# Patient Record
Sex: Male | Born: 1995 | Race: Black or African American | Hispanic: No | Marital: Single | Smoking: Never smoker
Health system: Southern US, Community
[De-identification: ages and names within clinical notes are randomized; demographics above are authoritative.]

## PROBLEM LIST (undated history)

## (undated) HISTORY — PX: WISDOM TOOTH EXTRACTION: SHX21

---
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
## 2015-07-01 ENCOUNTER — Encounter: Payer: Self-pay | Admitting: Physician Assistant

## 2015-07-01 ENCOUNTER — Ambulatory Visit (INDEPENDENT_AMBULATORY_CARE_PROVIDER_SITE_OTHER): Payer: Worker's Compensation | Admitting: Physician Assistant

## 2015-07-01 VITALS — BP 131/90 | HR 69 | Temp 97.9°F | Ht 73.0 in | Wt 218.6 lb

## 2015-07-01 DIAGNOSIS — S61219A Laceration without foreign body of unspecified finger without damage to nail, initial encounter: Secondary | ICD-10-CM

## 2015-07-01 DIAGNOSIS — S61213A Laceration without foreign body of left middle finger without damage to nail, initial encounter: Secondary | ICD-10-CM

## 2015-07-01 MED ORDER — SULFAMETHOXAZOLE-TRIMETHOPRIM 800-160 MG PO TABS: 1.0000 | ORAL_TABLET | Freq: Two times a day (BID) | ORAL | Status: DC

## 2015-07-01 NOTE — Patient Instructions (Signed)

## 2015-07-01 NOTE — Progress Notes (Signed)
   Subjective:    Patient ID: Kent Roman, male    DOB: 01-21-1996, 19 y.o.   MRN: [id number]  HPI 19 y/o male presents with laceration on  3rd finger, left hand s/p injury at work on 07/01/15 while cutting on a mattress top on a machine. He does not know if he is utd on his tetanus vaccine.     Review of Systems  Constitutional: Negative.   HENT: Negative.   Eyes: Negative.   Respiratory: Negative.   Cardiovascular: Negative.   Gastrointestinal: Negative.   Skin:       Laceration on 3rd finger, left hand       Objective:   Physical Exam  Constitutional: He appears well-developed and well-nourished.  Cardiovascular: Intact distal pulses.   Skin:  Approximately 3 cm laceration of 3rd finger, left hand. Slightly extends into the nail bed. No tendon involvement.  Nursing note and vitals reviewed.         Assessment & Plan:  1. Laceration of finger, initial encounter - Wound cleansed and 4.0 nylon sutures x 8.  - sulfamethoxazole-trimethoprim (BACTRIM DS,SEPTRA DS) 800-160 MG per tablet; Take 1 tablet by mouth 2 (two) times daily.  Dispense: 20 tablet; Refill: 0  Patient will rtc in 10 days for suture removal. May return to work with no restrictions    2. Need for Tetanus- patient will check status and we will give on follow up if needed.   Jayjay Littles A. Chauncey ReadingGann PA-C

## 2015-07-03 ENCOUNTER — Encounter: Payer: Self-pay | Admitting: Physician Assistant

## 2015-07-08 ENCOUNTER — Telehealth: Payer: Self-pay | Admitting: Family Medicine

## 2015-07-09 ENCOUNTER — Other Ambulatory Visit: Payer: Self-pay | Admitting: Physician Assistant

## 2015-07-09 ENCOUNTER — Ambulatory Visit (INDEPENDENT_AMBULATORY_CARE_PROVIDER_SITE_OTHER): Payer: Worker's Compensation | Admitting: Physician Assistant

## 2015-07-09 ENCOUNTER — Encounter: Payer: Self-pay | Admitting: Physician Assistant

## 2015-07-09 VITALS — BP 130/86 | HR 56 | Temp 98.2°F | Ht 73.0 in | Wt 231.0 lb

## 2015-07-09 DIAGNOSIS — Z23 Encounter for immunization: Secondary | ICD-10-CM

## 2015-07-09 DIAGNOSIS — S61213D Laceration without foreign body of left middle finger without damage to nail, subsequent encounter: Secondary | ICD-10-CM

## 2015-07-09 DIAGNOSIS — Z4802 Encounter for removal of sutures: Secondary | ICD-10-CM | POA: Diagnosis not present

## 2015-07-09 NOTE — Telephone Encounter (Signed)
Appointment rescheduled for today at 3:40.

## 2015-07-09 NOTE — Progress Notes (Addendum)
Patient ID: Kent Roman, male   DOB: 04-26-1996, 19 y.o.   MRN: [id number]   19 y/o male presents for suture removal s/p laceration of 3rd finger, left hand that occurred while he was working. He has had no complications, took all of his antibiotic.   PE: 1 suture was removed accidentally by patient when removing bandage. Distal pulses intact. Skin intact with no dehiscence of wound.   A/P: Advised patient to apply vaseline or vitamin e oil for scar improvment. No further treatment needed.  Patient can continue at work with no restrictions.  1. Sutures removed   2Need for prophylactic vaccination with combined diphtheria-tetanus-pertussis (DTP) vaccine  - Td : Tetanus/diphtheria >7yo Preservative  free    Kent Roman A. Chauncey Reading PA-C

## 2018-01-25 ENCOUNTER — Other Ambulatory Visit: Payer: Self-pay

## 2018-01-25 ENCOUNTER — Emergency Department (HOSPITAL_COMMUNITY)
Admission: EM | Admit: 2018-01-25 | Discharge: 2018-01-25 | Disposition: A | Discharge status: Home / Self Care | Payer: Self-pay | Attending: Emergency Medicine | Admitting: Emergency Medicine

## 2018-01-25 ENCOUNTER — Encounter (HOSPITAL_COMMUNITY): Payer: Self-pay

## 2018-01-25 DIAGNOSIS — R030 Elevated blood-pressure reading, without diagnosis of hypertension: Secondary | ICD-10-CM | POA: Insufficient documentation

## 2018-01-25 DIAGNOSIS — J45909 Unspecified asthma, uncomplicated: Secondary | ICD-10-CM | POA: Insufficient documentation

## 2018-01-25 DIAGNOSIS — R69 Illness, unspecified: Secondary | ICD-10-CM

## 2018-01-25 DIAGNOSIS — J111 Influenza due to unidentified influenza virus with other respiratory manifestations: Secondary | ICD-10-CM | POA: Insufficient documentation

## 2018-01-25 MED ORDER — OSELTAMIVIR PHOSPHATE 75 MG PO CAPS
75.0000 mg | ORAL_CAPSULE | Freq: Once | ORAL | Status: AC
  Administered 2018-01-25: 75 mg via ORAL
  Filled 2018-01-25: qty 1

## 2018-01-25 MED ORDER — LORATADINE-PSEUDOEPHEDRINE ER 5-120 MG PO TB12: 1.0000 | ORAL_TABLET | Freq: Two times a day (BID) | ORAL | 0 refills | Status: DC

## 2018-01-25 MED ORDER — IBUPROFEN 600 MG PO TABS: 600.0000 mg | ORAL_TABLET | Freq: Four times a day (QID) | ORAL | 0 refills | Status: DC

## 2018-01-25 MED ORDER — IBUPROFEN 800 MG PO TABS
800.0000 mg | ORAL_TABLET | Freq: Once | ORAL | Status: AC
  Administered 2018-01-25: 800 mg via ORAL
  Filled 2018-01-25: qty 1

## 2018-01-25 MED ORDER — ONDANSETRON HCL 4 MG PO TABS
4.0000 mg | ORAL_TABLET | Freq: Once | ORAL | Status: AC
Start: 2018-01-25 — End: 2018-01-25
  Administered 2018-01-25: 4 mg via ORAL
  Filled 2018-01-25: qty 1

## 2018-01-25 NOTE — ED Provider Notes (Signed)
[facility] EMERGENCY DEPARTMENT Provider Note   CSN: [id number] Arrival date & time: 01/25/18  1032     History   Chief Complaint Chief Complaint  Patient presents with  . Generalized Body Aches    HPI Kent Roman is a 22 y.o. male.  The history is provided by the patient.  URI   This is a new problem. The current episode started yesterday. The problem has been gradually worsening. Associated symptoms include congestion, rhinorrhea, sore throat and cough. Pertinent negatives include no chest pain, no abdominal pain, no dysuria, no neck pain and no wheezing. He has tried nothing for the symptoms.    Past Medical History:  Diagnosis Date  . Asthma     There are no active problems to display for this patient.   History reviewed. No pertinent surgical history.     Home Medications    Prior to Admission medications   Not on File    Family History Family History  Problem Relation Age of Onset  . Hypertension Other   . Cancer Other     Social History Social History   Tobacco Use  . Smoking status: Never Smoker  . Smokeless tobacco: Never Used  Substance Use Topics  . Alcohol use: No  . Drug use: No     Allergies   Patient has no known allergies.   Review of Systems Review of Systems  Constitutional: Positive for activity change, appetite change and chills.       All ROS Neg except as noted in HPI  HENT: Positive for congestion, rhinorrhea and sore throat. Negative for nosebleeds.   Eyes: Negative for photophobia and discharge.  Respiratory: Positive for cough. Negative for shortness of breath and wheezing.   Cardiovascular: Negative for chest pain and palpitations.  Gastrointestinal: Negative for abdominal pain and blood in stool.  Genitourinary: Negative for dysuria, frequency and hematuria.  Musculoskeletal: Negative for arthralgias, back pain and neck pain.  Skin: Negative.   Neurological: Negative for dizziness, seizures and speech  difficulty.  Psychiatric/Behavioral: Negative for confusion and hallucinations.     Physical Exam Updated Vital Signs BP (!) 156/93 (BP Location: Right Arm)   Pulse 68   Temp 99.4 F (37.4 C) (Oral)   Resp 16   Ht 6\' 2"  (1.88 m)   Wt 97.5 kg (215 lb)   SpO2 100%   BMI 27.60 kg/m   Physical Exam  Constitutional: He is oriented to person, place, and time. He appears well-developed and well-nourished.  Non-toxic appearance.  HENT:  Head: Normocephalic.  Right Ear: Tympanic membrane and external ear normal.  Left Ear: Tympanic membrane and external ear normal.  Nasal congestion present.  Eyes: EOM and lids are normal. Pupils are equal, round, and reactive to light.  Neck: Normal range of motion. Neck supple. Carotid bruit is not present.  Cardiovascular: Normal rate, regular rhythm, normal heart sounds, intact distal pulses and normal pulses.  Pulmonary/Chest: Breath sounds normal. No respiratory distress.  Abdominal: Soft. Bowel sounds are normal. There is no tenderness. There is no guarding.  Musculoskeletal: Normal range of motion.  Lymphadenopathy:       Head (right side): No submandibular adenopathy present.       Head (left side): No submandibular adenopathy present.    He has no cervical adenopathy.  Neurological: He is alert and oriented to person, place, and time. He has normal strength. No cranial nerve deficit or sensory deficit. Coordination normal.  Skin: Skin is warm and dry.  Psychiatric: He has a normal mood and affect. His speech is normal.  Nursing note and vitals reviewed.    ED Treatments / Results  Labs (all labs ordered are listed, but only abnormal results are displayed) Labs Reviewed - No data to display  EKG  EKG Interpretation None       Radiology No results found.  Procedures Procedures (including critical care time)  Medications Ordered in ED Medications - No data to display   Initial Impression / Assessment and Plan / ED Course    I have reviewed the triage vital signs and the nursing notes.  Pertinent labs & imaging results that were available during my care of the patient were reviewed by me and considered in my medical decision making (see chart for details).       Final Clinical Impressions(s) / ED Diagnoses MDM Blood pressure is elevated at 156/93, otherwise the vital signs are within normal limits.  I have asked the patient have the blood pressure rechecked soon.  The examination suggest influenza-like illness.  Patient will increase fluids.  I have asked him to wash hands frequently and wipe down surfaces.  I have also asked the patient to use Claritin-D and ibuprofen for symptoms.  The patient is to return to the emergency department if any changes, problems, or concerns.   Final diagnoses:  Influenza-like illness    ED Discharge Orders    None       Ivery QualeBryant, Jonnelle Lawniczak, PA-C 01/25/18 1151    Loren RacerYelverton, David, MD 01/25/18 1531

## 2018-01-25 NOTE — ED Triage Notes (Signed)
Reports of generalized body aches, cough and nasal drainage since last night at work.

## 2018-01-25 NOTE — Discharge Instructions (Signed)
Please wash hands frequently.  Please increase water, juices, Gatorade, etc.  Use 600 mg of ibuprofen every 6 hours for fever and aching.  Use Claritin-D for congestion and cough.  Please see your primary physician or return to the emergency department if any changes, problems, or concerns.

## 2018-02-04 ENCOUNTER — Other Ambulatory Visit: Payer: Self-pay

## 2018-02-04 ENCOUNTER — Encounter (HOSPITAL_COMMUNITY): Payer: Self-pay | Admitting: *Deleted

## 2018-02-04 DIAGNOSIS — J45909 Unspecified asthma, uncomplicated: Secondary | ICD-10-CM | POA: Insufficient documentation

## 2018-02-04 DIAGNOSIS — G43909 Migraine, unspecified, not intractable, without status migrainosus: Secondary | ICD-10-CM | POA: Insufficient documentation

## 2018-02-04 DIAGNOSIS — L723 Sebaceous cyst: Secondary | ICD-10-CM | POA: Insufficient documentation

## 2018-02-04 NOTE — ED Triage Notes (Signed)
Pt c/o bump to right side of top of head that has been there about 2 months; pt states the pain is getting worse; pt denies any drainage; bump feels soft to touch

## 2018-02-05 ENCOUNTER — Emergency Department (HOSPITAL_COMMUNITY)
Admission: EM | Admit: 2018-02-05 | Discharge: 2018-02-05 | Disposition: A | Discharge status: Home / Self Care | Payer: Self-pay | Attending: Emergency Medicine | Admitting: Emergency Medicine

## 2018-02-05 DIAGNOSIS — G43009 Migraine without aura, not intractable, without status migrainosus: Secondary | ICD-10-CM

## 2018-02-05 DIAGNOSIS — L729 Follicular cyst of the skin and subcutaneous tissue, unspecified: Secondary | ICD-10-CM

## 2018-02-05 MED ORDER — METOCLOPRAMIDE HCL 5 MG/ML IJ SOLN
10.0000 mg | Freq: Once | INTRAMUSCULAR | Status: AC
  Administered 2018-02-05: 10 mg via INTRAVENOUS
  Filled 2018-02-05: qty 2

## 2018-02-05 MED ORDER — SODIUM CHLORIDE 0.9 % IV BOLUS (SEPSIS)
1000.0000 mL | Freq: Once | INTRAVENOUS | Status: AC
  Administered 2018-02-05: 1000 mL via INTRAVENOUS

## 2018-02-05 MED ORDER — DIPHENHYDRAMINE HCL 50 MG/ML IJ SOLN
25.0000 mg | Freq: Once | INTRAMUSCULAR | Status: AC
  Administered 2018-02-05: 25 mg via INTRAVENOUS
  Filled 2018-02-05: qty 1

## 2018-02-05 MED ORDER — SODIUM CHLORIDE 0.9 % IV BOLUS (SEPSIS)
500.0000 mL | Freq: Once | INTRAVENOUS | Status: AC
  Administered 2018-02-05: 500 mL via INTRAVENOUS

## 2018-02-05 MED ORDER — KETOROLAC TROMETHAMINE 30 MG/ML IJ SOLN
30.0000 mg | Freq: Once | INTRAMUSCULAR | Status: AC
  Administered 2018-02-05: 30 mg via INTRAVENOUS
  Filled 2018-02-05: qty 1

## 2018-02-05 NOTE — ED Provider Notes (Signed)
[facility] EMERGENCY DEPARTMENT Provider Note   CSN: [id number] Arrival date & time: 02/04/18  2316  Time seen 12:50 AM   History   Chief Complaint Chief Complaint  Patient presents with  . Headache    HPI Kent Roman is a 22 y.o. male.  HPI patient states he noticed a hard lump in his scalp on the right top of his head 2-3 months ago.  He states it has not changed in that time.  It is been painful the whole time it has been there.  It does not drain.  It has not gotten bigger.  He states he has a change in his headache pattern from about 1 a week to now twice a week.  He states his headaches generally is in the right eye and right frontal area and is throbbing.  He states his last headache started about 4 PM tonight.  He took ibuprofen 600 mg and laid down to go to sleep.  When he woke up to go to work he states he still had the headache.  He states he needs a work note.  He denies nausea, vomiting, blurred vision, numbness or tingling of his extremities.  He denies any fever or drainage from the lesion on his scalp.  He states with his headache he has photophobia and noise sensitivity.  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  He denies any known trauma.  PCP none  Past Medical History:  Diagnosis Date  . Asthma     There are no active problems to display for this patient.   History reviewed. No pertinent surgical history.     Home Medications    Prior to Admission medications   Medication Sig Start Date End Date Taking? Authorizing Provider  ibuprofen (ADVIL,MOTRIN) 600 MG tablet Take 1 tablet (600 mg total) by mouth 4 (four) times daily. 01/25/18   Ivery Quale, PA-C  loratadine-pseudoephedrine (CLARITIN-D 12 HOUR) 5-120 MG tablet Take 1 tablet by mouth 2 (two) times daily. 01/25/18   Ivery Quale, PA-C    Family History Family History  Problem Relation Age of Onset  . Hypertension  Other   . Cancer Other     Social History Social History   Tobacco Use  . Smoking status: Never Smoker  . Smokeless tobacco: Never Used  Substance Use Topics  . Alcohol use: No  . Drug use: No  employed   Allergies   Patient has no known allergies.   Review of Systems Review of Systems  All other systems reviewed and are negative.    Physical Exam Updated Vital Signs BP (!) 140/93 (BP Location: Right Arm)   Pulse 69   Temp 98.2 F (36.8 C) (Oral)   Resp 18   Ht 6\' 2"  (1.88 m)   Wt 97.5 kg (215 lb)   SpO2 99%   BMI 27.60 kg/m   Vital signs normal    Physical Exam  Constitutional: He is oriented to person, place, and time. He appears well-developed and well-nourished.  Non-toxic appearance. He does not appear ill. No distress.  HENT:  Head: Normocephalic and atraumatic.  Right Ear: External ear normal.  Left Ear: External ear normal.  Nose: Nose normal. No mucosal edema or rhinorrhea.  Mouth/Throat: Oropharynx is clear and moist and mucous membranes are normal. No dental abscesses or uvula swelling.  Eyes: Conjunctivae and EOM are normal. Pupils are equal, round,  and reactive to light.  Neck: Normal range of motion and full passive range of motion without pain. Neck supple.  Cardiovascular: Normal rate, regular rhythm and normal heart sounds. Exam reveals no gallop and no friction rub.  No murmur heard. Pulmonary/Chest: Effort normal and breath sounds normal. No respiratory distress. He has no wheezes. He has no rhonchi. He has no rales. He exhibits no tenderness and no crepitus.  Abdominal: Soft. Normal appearance and bowel sounds are normal. He exhibits no distension. There is no tenderness. There is no rebound and no guarding.  Musculoskeletal: Normal range of motion. He exhibits no edema or tenderness.  Moves all extremities well.   Neurological: He is alert and oriented to person, place, and time. He has normal strength. No cranial nerve deficit.  Skin:  Skin is warm, dry and intact. No rash noted. No erythema. No pallor.  Patient has a firm mass in his scalp on the right of midline and on the top posterior aspect of the scalp.  The skin overlying it is not red, there is no pustules.  The skin is not red to touch.  It is firm and appears to be tender.  It appears to be about the size of 1 cm.  Psychiatric: He has a normal mood and affect. His speech is normal and behavior is normal. His mood appears not anxious.  Nursing note and vitals reviewed.    ED Treatments / Results  Labs (all labs ordered are listed, but only abnormal results are displayed) Labs Reviewed - No data to display  EKG  EKG Interpretation None       Radiology No results found.  Procedures Procedures (including critical care time)  Medications Ordered in ED Medications  sodium chloride 0.9 % bolus 1,000 mL (1,000 mLs Intravenous New Bag/Given 02/05/18 0134)  sodium chloride 0.9 % bolus 500 mL (500 mLs Intravenous New Bag/Given 02/05/18 0135)  metoCLOPramide (REGLAN) injection 10 mg (10 mg Intravenous Given 02/05/18 0135)  diphenhydrAMINE (BENADRYL) injection 25 mg (25 mg Intravenous Given 02/05/18 0135)  ketorolac (TORADOL) 30 MG/ML injection 30 mg (30 mg Intravenous Given 02/05/18 0135)     Initial Impression / Assessment and Plan / ED Course  I have reviewed the triage vital signs and the nursing notes.  Pertinent labs & imaging results that were available during my care of the patient were reviewed by me and considered in my medical decision making (see chart for details).     Patient states his headache is a "9 out of 10".  IV was inserted and he was given migraine cocktail.  Recheck 1:55 AM patient states his headaches much better, however his IVs have just been started.  I will have him ready to be discharged once his IV fluids are finished.  We discussed follow-up with a surgeon about having this probable cyst removed from his scalp because it is  painful.  At this point it does not appear to be an abscess because he states it has not changed in 3 months.  He also has migraine headaches that has been treated tonight and is resolved.  Final Clinical Impressions(s) / ED Diagnoses   Final diagnoses:  Migraine without aura and without status migrainosus, not intractable  Scalp cyst    ED Discharge Orders    None    OTC ibuprofen and acetaminophen   Plan discharge  Devoria AlbeIva Vanesa Renier, MD, Concha PyoFACEP    Quinnlyn Hearns, MD 02/05/18 [phone]

## 2018-02-05 NOTE — Discharge Instructions (Signed)
Call Dr York RamJenkin's office to discuss removal of the cyst in your scalp.  Recheck sooner if you get a fever or it gets bigger or more painful. Look at the information to help prevent migraine headaches. You can take ibuprofen 600 mg and/or acetaminophen 1000 mg every 6 hrs for pain if needed.

## 2018-02-05 NOTE — ED Notes (Signed)
respiratory paged

## 2018-02-27 ENCOUNTER — Encounter: Payer: Self-pay | Admitting: General Surgery

## 2018-02-27 ENCOUNTER — Ambulatory Visit: Payer: BLUE CROSS/BLUE SHIELD | Admitting: General Surgery

## 2018-02-27 VITALS — BP 151/95 | HR 71 | Temp 99.3°F | Ht 74.0 in | Wt 215.0 lb

## 2018-02-27 DIAGNOSIS — L723 Sebaceous cyst: Secondary | ICD-10-CM

## 2018-02-27 NOTE — Patient Instructions (Signed)
Epidermal Cyst An epidermal cyst is a small, painless lump under your skin. It may be called an epidermal inclusion cyst or an infundibular cyst. The cyst contains a grayish-white, bad-smelling substance (keratin). It is important not to pop epidermal cysts yourself. These cysts are usually harmless (benign), but they can get infected. Symptoms of infection may include:  Redness.  Inflammation.  Tenderness.  Warmth.  Fever.  A grayish-white, bad-smelling substance draining from the cyst.  Pus draining from the cyst.  Follow these instructions at home:  Take over-the-counter and prescription medicines only as told by your doctor.  If you were prescribed an antibiotic, use it as told by your doctor. Do not stop using the antibiotic even if you start to feel better.  Keep the area around your cyst clean and dry.  Wear loose, dry clothing.  Do not try to pop your cyst.  Avoid touching your cyst.  Check your cyst every day for signs of infection.  Keep all follow-up visits as told by your doctor. This is important. How is this prevented?  Wear clean, dry, clothing.  Avoid wearing tight clothing.  Keep your skin clean and dry. Shower or take baths every day.  Wash your body with a benzoyl peroxide wash when you shower or bathe. Contact a health care provider if:  Your cyst has symptoms of infection.  Your condition is not improving or is getting worse.  You have a cyst that looks different from other cysts you have had.  You have a fever. Get help right away if:  Redness spreads from the cyst into the surrounding area. This information is not intended to replace advice given to you by your health care provider. Make sure you discuss any questions you have with your health care provider. Document Released: 01/12/2005 Document Revised: 08/03/2016 Document Reviewed: 10/07/2015 Elsevier Interactive Patient Education  2018 [employer]  

## 2018-02-27 NOTE — Progress Notes (Signed)
Daneen SchickChauncee Zuercher; [id number]; October 04, 1996   HPI Patient is a 22 year old black male who was referred to my care by the emergency room for evaluation and treatment of a cyst on his scalp.  He was seen in the emergency room recently due to tenderness from the cyst which was causing him migraine headaches.  He states that the cyst has been present for many months now, but has increased in size and is causing him to have headaches and discomfort.  He currently has a 7 out of 10 pain.  No drainage has been noted. Past Medical History:  Diagnosis Date  . Asthma     History reviewed. No pertinent surgical history.  Family History  Problem Relation Age of Onset  . Hypertension Other   . Cancer Other     Current Outpatient Medications on File Prior to Visit  Medication Sig Dispense Refill  . ibuprofen (ADVIL,MOTRIN) 600 MG tablet Take 1 tablet (600 mg total) by mouth 4 (four) times daily. 30 tablet 0  . loratadine-pseudoephedrine (CLARITIN-D 12 HOUR) 5-120 MG tablet Take 1 tablet by mouth 2 (two) times daily. 20 tablet 0   No current facility-administered medications on file prior to visit.     No Known Allergies  Social History   Substance and Sexual Activity  Alcohol Use No    Social History   Tobacco Use  Smoking Status Never Smoker  Smokeless Tobacco Never Used    Review of Systems  Constitutional: Negative.   HENT: Negative.   Eyes: Negative.   Respiratory: Negative.   Cardiovascular: Negative.   Gastrointestinal: Negative.   Genitourinary: Negative.   Musculoskeletal: Negative.   Skin: Negative.   Neurological: Negative.   Endo/Heme/Allergies: Negative.   Psychiatric/Behavioral: Negative.     Objective   Vitals:   02/27/18 1124  BP: (!) 151/95  Pulse: 71  Temp: 99.3 F (37.4 C)    Physical Exam  Constitutional: He is oriented to person, place, and time and well-developed, well-nourished, and in no distress.  HENT:  Head: Normocephalic and atraumatic.  1.5  cm oval subcutaneous mobile mass along the right parietal region of scalp.  No drainage noted.  Tender to touch.  Cardiovascular: Normal rate, regular rhythm and normal heart sounds. Exam reveals no gallop and no friction rub.  No murmur heard. Pulmonary/Chest: Effort normal and breath sounds normal. No respiratory distress. He has no wheezes. He has no rales.  Neurological: He is alert and oriented to person, place, and time.  Skin: Skin is warm and dry.  Vitals reviewed.  ER notes reviewed Assessment  Sebaceous cyst, scalp Plan   Patient will call to schedule excision of sebaceous cyst scalp.  The risks and benefits of the procedure including bleeding, infection, and recurrence of the cyst were fully explained to the patient, who gave informed consent.

## 2018-03-28 NOTE — Patient Instructions (Signed)
Kent Roman  03/28/2018     @PREFPERIOPPHARMACY @   Your procedure is scheduled on 04/02/2018.  Report to [facility] at 9:20 A.M.  Call this number if you have problems the morning of surgery:  [phone]   Remember:  Do not eat food or drink liquids after midnight.  Take these medicines the morning of surgery with A SIP OF WATER Claritin   Do not wear jewelry, make-up or nail polish.  Do not wear lotions, powders, or perfumes, or deodorant.  Do not shave 48 hours prior to surgery.  Men may shave face and neck.  Do not bring valuables to the hospital.  [facility] is not responsible for any belongings or valuables.  Contacts, dentures or bridgework may not be worn into surgery.  Leave your suitcase in the car.  After surgery it may be brought to your room.  For patients admitted to the hospital, discharge time will be determined by your treatment team.  Patients discharged the day of surgery will not be allowed to drive home.    Please read over the following fact sheets that you were given. Surgical Site Infection Prevention and Anesthesia Post-op Instructions     PATIENT INSTRUCTIONS POST-ANESTHESIA  IMMEDIATELY FOLLOWING SURGERY:  Do not drive or operate machinery for the first twenty four hours after surgery.  Do not make any important decisions for twenty four hours after surgery or while taking narcotic pain medications or sedatives.  If you develop intractable nausea and vomiting or a severe headache please notify your doctor immediately.  FOLLOW-UP:  Please make an appointment with your surgeon as instructed. You do not need to follow up with anesthesia unless specifically instructed to do so.  WOUND CARE INSTRUCTIONS (if applicable):  Keep a dry clean dressing on the anesthesia/puncture wound site if there is drainage.  Once the wound has quit draining you may leave it open to air.  Generally you should leave the bandage intact for twenty four hours unless there  is drainage.  If the epidural site drains for more than 36-48 hours please call the anesthesia department.  QUESTIONS?:  Please feel free to call your physician or the hospital operator if you have any questions, and they will be happy to assist you.      Epidermal Cyst Removal Epidermal cyst removal is a procedure to remove a sac of oily material that forms under your skin (epidermal cyst). Epidermal cysts may also be called epidermoid cysts or keratin cysts. Normally, the skin secretes this oily material through a gland or a hair follicle. This type of cyst usually results when a skin gland or hair follicle becomes blocked. You may need this procedure if you have an epidermal cyst that becomes large, uncomfortable, or infected. Tell a health care provider about:  Any allergies you have.  All medicines you are taking, including vitamins, herbs, eye drops, creams, and over-the-counter medicines.  Any problems you or family members have had with anesthetic medicines.  Any blood disorders you have.  Any surgeries you have had.  Any medical conditions you have. What are the risks? Generally, this is a safe procedure. However, problems may occur, including:  Developing another cyst.  Bleeding.  Infection.  Scarring.  What happens before the procedure?  Ask your health care provider about: ? Changing or stopping your regular medicines. This is especially important if you are taking diabetes medicines or blood thinners. ? Taking medicines such as aspirin and ibuprofen. These medicines can thin  your blood. Do not take these medicines before your procedure if your health care provider instructs you not to.  If you have an infected cyst, you may have to take antibiotic medicines before or after the cyst removal. Take your antibiotics as directed by your health care provider. Finish all of the medicine even if you start to feel better.  Take a shower on the morning of your procedure.  Your health care provider may ask you to use a germ-killing (antiseptic) soap. What happens during the procedure?  You will be given a medicine that numbs the area (local anesthetic).  The skin around the cyst will be cleaned with a germ-killing solution (antiseptic).  Your health care provider will make a small surgical incision over the cyst.  The cyst will be separated from the surrounding tissues that are under your skin.  If possible, the cyst will be removed undamaged (intact).  If the cyst bursts (ruptures), it will need to be removed in pieces.  After the cyst is removed, your health care provider will control any bleeding and close the incision with small stitches (sutures). Small incisions may not need sutures, and the bleeding will be controlled by applying direct pressure with gauze.  Your health care provider may apply antibiotic ointment and a light bandage (dressing) over the incision. This procedure may vary among health care providers and hospitals. What happens after the procedure?  If your cyst ruptured during surgery, you may need to take antibiotic medicine. If you were prescribed an antibiotic medicine, finish all of it even if you start to feel better. This information is not intended to replace advice given to you by your health care provider. Make sure you discuss any questions you have with your health care provider. Document Released: 12/02/2000 Document Revised: 05/12/2016 Document Reviewed: 08/20/2014 Elsevier Interactive Patient Education  2018 [employer]

## 2018-03-29 NOTE — H&P (Signed)
Kent Roman; [id number]; 04/18/1996   HPI Patient is a 21-year-old black male who was referred to my care by the emergency room for evaluation and treatment of a cyst on his scalp.  He was seen in the emergency room recently due to tenderness from the cyst which was causing him migraine headaches.  He states that the cyst has been present for many months now, but has increased in size and is causing him to have headaches and discomfort.  He currently has a 7 out of 10 pain.  No drainage has been noted. Past Medical History:  Diagnosis Date  . Asthma     History reviewed. No pertinent surgical history.  Family History  Problem Relation Age of Onset  . Hypertension Other   . Cancer Other     Current Outpatient Medications on File Prior to Visit  Medication Sig Dispense Refill  . ibuprofen (ADVIL,MOTRIN) 600 MG tablet Take 1 tablet (600 mg total) by mouth 4 (four) times daily. 30 tablet 0  . loratadine-pseudoephedrine (CLARITIN-D 12 HOUR) 5-120 MG tablet Take 1 tablet by mouth 2 (two) times daily. 20 tablet 0   No current facility-administered medications on file prior to visit.     No Known Allergies  Social History   Substance and Sexual Activity  Alcohol Use No    Social History   Tobacco Use  Smoking Status Never Smoker  Smokeless Tobacco Never Used    Review of Systems  Constitutional: Negative.   HENT: Negative.   Eyes: Negative.   Respiratory: Negative.   Cardiovascular: Negative.   Gastrointestinal: Negative.   Genitourinary: Negative.   Musculoskeletal: Negative.   Skin: Negative.   Neurological: Negative.   Endo/Heme/Allergies: Negative.   Psychiatric/Behavioral: Negative.     Objective   Vitals:   02/27/18 1124  BP: (!) 151/95  Pulse: 71  Temp: 99.3 F (37.4 C)    Physical Exam  Constitutional: He is oriented to person, place, and time and well-developed, well-nourished, and in no distress.  HENT:  Head: Normocephalic and atraumatic.  1.5  cm oval subcutaneous mobile mass along the right parietal region of scalp.  No drainage noted.  Tender to touch.  Cardiovascular: Normal rate, regular rhythm and normal heart sounds. Exam reveals no gallop and no friction rub.  No murmur heard. Pulmonary/Chest: Effort normal and breath sounds normal. No respiratory distress. He has no wheezes. He has no rales.  Neurological: He is alert and oriented to person, place, and time.  Skin: Skin is warm and dry.  Vitals reviewed.  ER notes reviewed Assessment  Sebaceous cyst, scalp Plan   Patient will call to schedule excision of sebaceous cyst scalp.  The risks and benefits of the procedure including bleeding, infection, and recurrence of the cyst were fully explained to the patient, who gave informed consent. 

## 2018-03-30 ENCOUNTER — Encounter (HOSPITAL_COMMUNITY)
Admission: RE | Admit: 2018-03-30 | Discharge: 2018-03-30 | Disposition: A | Discharge status: Home / Self Care | Payer: BLUE CROSS/BLUE SHIELD | Source: Ambulatory Visit | Attending: General Surgery | Admitting: General Surgery

## 2018-03-30 ENCOUNTER — Encounter (HOSPITAL_COMMUNITY): Payer: Self-pay

## 2018-03-30 ENCOUNTER — Other Ambulatory Visit: Payer: Self-pay

## 2018-03-30 DIAGNOSIS — L723 Sebaceous cyst: Secondary | ICD-10-CM | POA: Diagnosis not present

## 2018-03-30 DIAGNOSIS — Z79899 Other long term (current) drug therapy: Secondary | ICD-10-CM | POA: Diagnosis not present

## 2018-03-30 DIAGNOSIS — Z8249 Family history of ischemic heart disease and other diseases of the circulatory system: Secondary | ICD-10-CM | POA: Diagnosis not present

## 2018-03-30 DIAGNOSIS — J45909 Unspecified asthma, uncomplicated: Secondary | ICD-10-CM | POA: Diagnosis not present

## 2018-03-30 DIAGNOSIS — Z809 Family history of malignant neoplasm, unspecified: Secondary | ICD-10-CM | POA: Diagnosis not present

## 2018-03-30 LAB — CBC WITH DIFFERENTIAL/PLATELET
Basophils Absolute: 0 10*3/uL (ref 0.0–0.1)
Basophils Relative: 0 %
EOS PCT: 3 %
Eosinophils Absolute: 0.1 10*3/uL (ref 0.0–0.7)
HCT: 36.9 % — ABNORMAL LOW (ref 39.0–52.0)
Hemoglobin: 12 g/dL — ABNORMAL LOW (ref 13.0–17.0)
LYMPHS PCT: 48 %
Lymphs Abs: 1.6 10*3/uL (ref 0.7–4.0)
MCH: 30.9 pg (ref 26.0–34.0)
MCHC: 32.5 g/dL (ref 30.0–36.0)
MCV: 95.1 fL (ref 78.0–100.0)
MONO ABS: 0.2 10*3/uL (ref 0.1–1.0)
Monocytes Relative: 7 %
Neutro Abs: 1.4 10*3/uL — ABNORMAL LOW (ref 1.7–7.7)
Neutrophils Relative %: 42 %
PLATELETS: 124 10*3/uL — AB (ref 150–400)
RBC: 3.88 MIL/uL — ABNORMAL LOW (ref 4.22–5.81)
RDW: 12.2 % (ref 11.5–15.5)
WBC: 3.3 10*3/uL — ABNORMAL LOW (ref 4.0–10.5)

## 2018-04-02 ENCOUNTER — Ambulatory Visit (HOSPITAL_COMMUNITY): Payer: BLUE CROSS/BLUE SHIELD | Admitting: Anesthesiology

## 2018-04-02 ENCOUNTER — Other Ambulatory Visit: Payer: Self-pay

## 2018-04-02 ENCOUNTER — Encounter (HOSPITAL_COMMUNITY)
Admission: RE | Disposition: A | Discharge status: Home / Self Care | Payer: Self-pay | Source: Ambulatory Visit | Attending: General Surgery

## 2018-04-02 ENCOUNTER — Ambulatory Visit (HOSPITAL_COMMUNITY)
Admission: RE | Admit: 2018-04-02 | Discharge: 2018-04-02 | Disposition: A | Discharge status: Home / Self Care | Payer: BLUE CROSS/BLUE SHIELD | Source: Ambulatory Visit | Attending: General Surgery | Admitting: General Surgery

## 2018-04-02 ENCOUNTER — Encounter (HOSPITAL_COMMUNITY): Payer: Self-pay | Admitting: Anesthesiology

## 2018-04-02 DIAGNOSIS — Z809 Family history of malignant neoplasm, unspecified: Secondary | ICD-10-CM | POA: Insufficient documentation

## 2018-04-02 DIAGNOSIS — Z8249 Family history of ischemic heart disease and other diseases of the circulatory system: Secondary | ICD-10-CM | POA: Insufficient documentation

## 2018-04-02 DIAGNOSIS — J45909 Unspecified asthma, uncomplicated: Secondary | ICD-10-CM | POA: Insufficient documentation

## 2018-04-02 DIAGNOSIS — L723 Sebaceous cyst: Secondary | ICD-10-CM

## 2018-04-02 DIAGNOSIS — Z79899 Other long term (current) drug therapy: Secondary | ICD-10-CM | POA: Insufficient documentation

## 2018-04-02 HISTORY — PX: MASS EXCISION: SHX2000

## 2018-04-02 SURGERY — EXCISION MASS
Anesthesia: General | Site: Head

## 2018-04-02 MED ORDER — PROPOFOL 10 MG/ML IV BOLUS
INTRAVENOUS | Status: DC | PRN
  Administered 2018-04-02: 200 mg via INTRAVENOUS
  Administered 2018-04-02: 50 mg via INTRAVENOUS

## 2018-04-02 MED ORDER — MIDAZOLAM HCL 2 MG/2ML IJ SOLN
INTRAMUSCULAR | Status: AC
  Filled 2018-04-02: qty 2

## 2018-04-02 MED ORDER — FENTANYL CITRATE (PF) 100 MCG/2ML IJ SOLN
INTRAMUSCULAR | Status: DC | PRN
  Administered 2018-04-02 (×3): 50 ug via INTRAVENOUS

## 2018-04-02 MED ORDER — CEFAZOLIN SODIUM-DEXTROSE 2-4 GM/100ML-% IV SOLN
2.0000 g | INTRAVENOUS | Status: AC
  Administered 2018-04-02: 2 g via INTRAVENOUS

## 2018-04-02 MED ORDER — LIDOCAINE HCL (PF) 1 % IJ SOLN
INTRAMUSCULAR | Status: AC
  Filled 2018-04-02: qty 5

## 2018-04-02 MED ORDER — LIDOCAINE HCL (CARDIAC) 20 MG/ML IV SOLN
INTRAVENOUS | Status: DC | PRN
  Administered 2018-04-02: 80 mg via INTRAVENOUS

## 2018-04-02 MED ORDER — BUPIVACAINE HCL (PF) 0.5 % IJ SOLN
INTRAMUSCULAR | Status: DC | PRN
  Administered 2018-04-02: 1 mL

## 2018-04-02 MED ORDER — BACITRACIN-NEOMYCIN-POLYMYXIN 400-5-5000 EX OINT
TOPICAL_OINTMENT | CUTANEOUS | Status: DC | PRN
  Administered 2018-04-02: 1 via TOPICAL

## 2018-04-02 MED ORDER — ONDANSETRON HCL 4 MG/2ML IJ SOLN
INTRAMUSCULAR | Status: AC
  Filled 2018-04-02: qty 2

## 2018-04-02 MED ORDER — BACITRACIN-NEOMYCIN-POLYMYXIN 400-5-5000 EX OINT
TOPICAL_OINTMENT | CUTANEOUS | Status: AC
  Filled 2018-04-02: qty 1

## 2018-04-02 MED ORDER — PROMETHAZINE HCL 25 MG/ML IJ SOLN: 6.2500 mg | INTRAMUSCULAR | Status: DC | PRN

## 2018-04-02 MED ORDER — ONDANSETRON HCL 4 MG/2ML IJ SOLN
INTRAMUSCULAR | Status: DC | PRN
  Administered 2018-04-02: 4 mg via INTRAVENOUS

## 2018-04-02 MED ORDER — MIDAZOLAM HCL 2 MG/2ML IJ SOLN: 0.5000 mg | Freq: Once | INTRAMUSCULAR | Status: DC | PRN

## 2018-04-02 MED ORDER — KETOROLAC TROMETHAMINE 30 MG/ML IJ SOLN
30.0000 mg | Freq: Once | INTRAMUSCULAR | Status: AC
  Administered 2018-04-02: 30 mg via INTRAVENOUS
  Filled 2018-04-02: qty 1

## 2018-04-02 MED ORDER — 0.9 % SODIUM CHLORIDE (POUR BTL) OPTIME
TOPICAL | Status: DC | PRN
  Administered 2018-04-02: 1000 mL

## 2018-04-02 MED ORDER — ACETAMINOPHEN 10 MG/ML IV SOLN: 1000.0000 mg | Freq: Once | INTRAVENOUS | Status: DC | PRN

## 2018-04-02 MED ORDER — HYDROMORPHONE HCL 1 MG/ML IJ SOLN: 0.2500 mg | INTRAMUSCULAR | Status: DC | PRN

## 2018-04-02 MED ORDER — MIDAZOLAM HCL 2 MG/2ML IJ SOLN
INTRAMUSCULAR | Status: DC | PRN
  Administered 2018-04-02: 2 mg via INTRAVENOUS

## 2018-04-02 MED ORDER — BUPIVACAINE HCL (PF) 0.5 % IJ SOLN
INTRAMUSCULAR | Status: AC
  Filled 2018-04-02: qty 30

## 2018-04-02 MED ORDER — FENTANYL CITRATE (PF) 250 MCG/5ML IJ SOLN
INTRAMUSCULAR | Status: AC
  Filled 2018-04-02: qty 5

## 2018-04-02 MED ORDER — CHLORHEXIDINE GLUCONATE CLOTH 2 % EX PADS: 6.0000 | MEDICATED_PAD | Freq: Once | CUTANEOUS | Status: DC

## 2018-04-02 MED ORDER — LACTATED RINGERS IV SOLN
INTRAVENOUS | Status: DC
  Administered 2018-04-02 (×2): via INTRAVENOUS

## 2018-04-02 MED ORDER — CEFAZOLIN SODIUM-DEXTROSE 2-4 GM/100ML-% IV SOLN
INTRAVENOUS | Status: AC
  Filled 2018-04-02: qty 100

## 2018-04-02 MED ORDER — GLYCOPYRROLATE 0.2 MG/ML IJ SOLN
INTRAMUSCULAR | Status: DC | PRN
  Administered 2018-04-02 (×2): 0.1 mg via INTRAVENOUS

## 2018-04-02 MED ORDER — PROPOFOL 10 MG/ML IV BOLUS
INTRAVENOUS | Status: AC
  Filled 2018-04-02: qty 40

## 2018-04-02 SURGICAL SUPPLY — 31 items
ADH SKN CLS APL DERMABOND .7 (GAUZE/BANDAGES/DRESSINGS) ×1
BAG HAMPER (MISCELLANEOUS) ×3 IMPLANT
BANDAGE STRIP 1X3 FLEXIBLE (GAUZE/BANDAGES/DRESSINGS) ×3 IMPLANT
BLADE SURG SZ11 CARB STEEL (BLADE) ×3 IMPLANT
CHLORAPREP W/TINT 10.5 ML (MISCELLANEOUS) ×3 IMPLANT
CLOTH BEACON ORANGE TIMEOUT ST (SAFETY) ×3 IMPLANT
COVER LIGHT HANDLE STERIS (MISCELLANEOUS) ×6 IMPLANT
DECANTER SPIKE VIAL GLASS SM (MISCELLANEOUS) ×3 IMPLANT
DERMABOND ADVANCED (GAUZE/BANDAGES/DRESSINGS) ×2
DERMABOND ADVANCED .7 DNX12 (GAUZE/BANDAGES/DRESSINGS) ×1 IMPLANT
ELECT NEEDLE TIP 2.8 STRL (NEEDLE) ×3 IMPLANT
ELECT REM PT RETURN 9FT ADLT (ELECTROSURGICAL) ×3
ELECTRODE REM PT RTRN 9FT ADLT (ELECTROSURGICAL) ×1 IMPLANT
GLOVE BIOGEL PI IND STRL 7.0 (GLOVE) ×1 IMPLANT
GLOVE BIOGEL PI IND STRL 7.5 (GLOVE) ×1 IMPLANT
GLOVE BIOGEL PI INDICATOR 7.0 (GLOVE) ×2
GLOVE BIOGEL PI INDICATOR 7.5 (GLOVE) ×2
GLOVE SURG SS PI 7.0 STRL IVOR (GLOVE) ×3 IMPLANT
GLOVE SURG SS PI 7.5 STRL IVOR (GLOVE) ×3 IMPLANT
GOWN STRL REUS W/ TWL XL LVL3 (GOWN DISPOSABLE) ×1 IMPLANT
GOWN STRL REUS W/TWL LRG LVL3 (GOWN DISPOSABLE) ×3 IMPLANT
GOWN STRL REUS W/TWL XL LVL3 (GOWN DISPOSABLE) ×3
KIT TURNOVER KIT A (KITS) ×3 IMPLANT
MANIFOLD NEPTUNE II (INSTRUMENTS) ×3 IMPLANT
NEEDLE HYPO 25X1 1.5 SAFETY (NEEDLE) ×3 IMPLANT
NS IRRIG 1000ML POUR BTL (IV SOLUTION) ×3 IMPLANT
PACK MINOR (CUSTOM PROCEDURE TRAY) ×3 IMPLANT
PAD ARMBOARD 7.5X6 YLW CONV (MISCELLANEOUS) ×3 IMPLANT
SET BASIN LINEN APH (SET/KITS/TRAYS/PACK) ×3 IMPLANT
SUT ETHILON 4 0 P 3 18 (SUTURE) ×6 IMPLANT
SYR CONTROL 10ML LL (SYRINGE) ×3 IMPLANT

## 2018-04-02 NOTE — Transfer of Care (Signed)
Immediate Anesthesia Transfer of Care Note  Patient: Kent Roman  Procedure(s) Performed: EXCISION 1.5 CYST SCALP (N/A Head)  Patient Location: PACU  Anesthesia Type:General  Level of Consciousness: awake and patient cooperative  Airway & Oxygen Therapy: Patient Spontanous Breathing  Post-op Assessment: Report given to RN and Post -op Vital signs reviewed and stable  Post vital signs: Reviewed and stable  Last Vitals:  Vitals Value Taken Time  BP 126/73 04/02/2018 11:57 AM  Temp    Pulse 73 04/02/2018 11:58 AM  Resp 16 04/02/2018 11:58 AM  SpO2 94 % 04/02/2018 11:58 AM  Vitals shown include unvalidated device data.  Last Pain:  Vitals:   04/02/18 0950  TempSrc: Oral  PainSc: 0-No pain         Complications: No apparent anesthesia complications

## 2018-04-02 NOTE — Discharge Instructions (Signed)
Epidermal Cyst An epidermal cyst is a small, painless lump under your skin. It may be called an epidermal inclusion cyst or an infundibular cyst. The cyst contains a grayish-white, bad-smelling substance (keratin). It is important not to pop epidermal cysts yourself. These cysts are usually harmless (benign), but they can get infected. Symptoms of infection may include:  Redness.  Inflammation.  Tenderness.  Warmth.  Fever.  A grayish-white, bad-smelling substance draining from the cyst.  Pus draining from the cyst.  Follow these instructions at home:  Take over-the-counter and prescription medicines only as told by your doctor.  If you were prescribed an antibiotic, use it as told by your doctor. Do not stop using the antibiotic even if you start to feel better.  Keep the area around your cyst clean and dry.  Wear loose, dry clothing.  Do not try to pop your cyst.  Avoid touching your cyst.  Check your cyst every day for signs of infection.  Keep all follow-up visits as told by your doctor. This is important. How is this prevented?  Wear clean, dry, clothing.  Avoid wearing tight clothing.  Keep your skin clean and dry. Shower or take baths every day.  Wash your body with a benzoyl peroxide wash when you shower or bathe. Contact a health care provider if:  Your cyst has symptoms of infection.  Your condition is not improving or is getting worse.  You have a cyst that looks different from other cysts you have had.  You have a fever. Get help right away if:  Redness spreads from the cyst into the surrounding area. This information is not intended to replace advice given to you by your health care provider. Make sure you discuss any questions you have with your health care provider. Document Released: 01/12/2005 Document Revised: 08/03/2016 Document Reviewed: 10/07/2015 Elsevier Interactive Patient Education  2018 [employer].    General Anesthesia,  Adult, Care After These instructions provide you with information about caring for yourself after your procedure. Your health care provider may also give you more specific instructions. Your treatment has been planned according to current medical practices, but problems sometimes occur. Call your health care provider if you have any problems or questions after your procedure. What can I expect after the procedure? After the procedure, it is common to have:  Vomiting.  A sore throat.  Mental slowness.  It is common to feel:  Nauseous.  Cold or shivery.  Sleepy.  Tired.  Sore or achy, even in parts of your body where you did not have surgery.  Follow these instructions at home: For at least 24 hours after the procedure:  Do not: ? Participate in activities where you could fall or become injured. ? Drive. ? Use heavy machinery. ? Drink alcohol. ? Take sleeping pills or medicines that cause drowsiness. ? Make important decisions or sign legal documents. ? Take care of children on your own.  Rest. Eating and drinking  If you vomit, drink water, juice, or soup when you can drink without vomiting.  Drink enough fluid to keep your urine clear or pale yellow.  Make sure you have little or no nausea before eating solid foods.  Follow the diet recommended by your health care provider. General instructions  Have a responsible adult stay with you until you are awake and alert.  Return to your normal activities as told by your health care provider. Ask your health care provider what activities are safe for you.  Take  over-the-counter and prescription medicines only as told by your health care provider.  If you smoke, do not smoke without supervision.  Keep all follow-up visits as told by your health care provider. This is important. Contact a health care provider if:  You continue to have nausea or vomiting at home, and medicines are not helpful.  You cannot drink fluids  or start eating again.  You cannot urinate after 8-12 hours.  You develop a skin rash.  You have fever.  You have increasing redness at the site of your procedure. Get help right away if:  You have difficulty breathing.  You have chest pain.  You have unexpected bleeding.  You feel that you are having a life-threatening or urgent problem. This information is not intended to replace advice given to you by your health care provider. Make sure you discuss any questions you have with your health care provider. Document Released: 03/13/2001 Document Revised: 05/09/2016 Document Reviewed: 11/19/2015 Elsevier Interactive Patient Education  2018 [employer]                             Excuse from Work, [employer], or Physical Activity Kent Roman needs to be excused from: ____ Work ____ [employer] ____ Physical activity  beginning now and through the following date: Wednesday, April 24th, 2019.  Health Care Provider Name (printed): ____Dr. Loraine LericheMark Jenkins____________________________________ Health Care Provider (signature): ___________________________________________ Date: ________________ This information is not intended to replace advice given to you by your health care provider. Make sure you discuss any questions you have with your health care provider. Document Released: 05/31/2001 Document Revised: 11/18/2016 Document Reviewed: 07/07/2014 Elsevier Interactive Patient Education  [employer]2018 Elsevier Inc.

## 2018-04-02 NOTE — Op Note (Signed)
Patient:  Kent Roman  DOB:  1996-05-11  MRN:  [id number]   Preop Diagnosis: Sebaceous cyst, scalp, 1.5 cm  Postop Diagnosis: Same  Procedure: Excision of sebaceous cyst, scalp  Surgeon: Franky MachoMark Jodell Weitman, MD  Anes: General  Indications: Patient is a 22 year old black male who presents with a tender enlarging cyst in the right parietal region of the scalp.  The risks and benefits of the procedure including bleeding, infection, and recurrence of the cyst were fully explained to the patient, who gave informed consent.  Procedure note: The patient was placed in the supine position.  After general anesthesia was administered, the right parietal region was prepped and draped using the usual sterile technique with DuraPrep.  Surgical site confirmation was performed.  Incision was made over the cyst.  The sebum and cyst wall were excised without difficulty.  It was disposed of.  It measured approximately 1.5 cm in its greatest diameter.  A bleeding was controlled using Bovie electrocautery.  0.5% Sensorcaine was instilled into the surrounding wound.  The skin was closed using 4-0 nylon interrupted sutures.  Triple antibiotic ointment was applied.  All tape and needle counts were correct at the end of the procedure.  The patient was awakened and transferred to PACU in stable condition.  Complications: None  EBL: Minimal  Specimen: None

## 2018-04-02 NOTE — Anesthesia Postprocedure Evaluation (Signed)
Anesthesia Post Note  Patient: Advertising account plannerChauncee Bouchillon  Procedure(s) Performed: EXCISION 1.5 CYST SCALP (N/A Head)  Patient location during evaluation: PACU Anesthesia Type: General Level of consciousness: awake and alert Pain management: pain level controlled Vital Signs Assessment: post-procedure vital signs reviewed and stable Respiratory status: spontaneous breathing, nonlabored ventilation, respiratory function stable and patient connected to nasal cannula oxygen Cardiovascular status: blood pressure returned to baseline and stable Postop Assessment: no apparent nausea or vomiting Anesthetic complications: no     Last Vitals:  Vitals:   04/02/18 1230 04/02/18 1244  BP: 133/87 128/89  Pulse: 65 63  Resp: 15 15  Temp:  36.7 C  SpO2: 95% 98%    Last Pain:  Vitals:   04/02/18 1251  TempSrc:   PainSc: 4                  Allena EaringJeffrey C Benigno Check

## 2018-04-02 NOTE — Anesthesia Procedure Notes (Signed)
Procedure Name: LMA Insertion Date/Time: 04/02/2018 11:04 AM Performed by: Earmon PhoenixWilkerson, Orlondo Holycross P, CRNA Pre-anesthesia Checklist: Patient identified, Emergency Drugs available, Suction available, Patient being monitored and Timeout performed Patient Re-evaluated:Patient Re-evaluated prior to induction Oxygen Delivery Method: Circle system utilized Preoxygenation: Pre-oxygenation with 100% oxygen Induction Type: IV induction Ventilation: Mask ventilation without difficulty LMA: LMA inserted LMA Size: 5.0 Number of attempts: 1 Placement Confirmation: positive ETCO2,  CO2 detector and breath sounds checked- equal and bilateral Tube secured with: Tape Dental Injury: Teeth and Oropharynx as per pre-operative assessment

## 2018-04-02 NOTE — Interval H&P Note (Signed)
History and Physical Interval Note:  04/02/2018 10:48 AM  Kent Roman  has presented today for surgery, with the diagnosis of sebaceous cyst on scalp  The various methods of treatment have been discussed with the patient and family. After consideration of risks, benefits and other options for treatment, the patient has consented to  Procedure(s): EXCISION 1.5 CYST SCALP (N/A) as a surgical intervention .  The patient's history has been reviewed, patient examined, no change in status, stable for surgery.  I have reviewed the patient's chart and labs.  Questions were answered to the patient's satisfaction.     Franky MachoMark Lamyiah Crawshaw

## 2018-04-02 NOTE — Anesthesia Preprocedure Evaluation (Addendum)
Anesthesia Evaluation  Patient identified by MRN, date of birth, ID band Patient awake    Reviewed: Allergy & Precautions, H&P , NPO status , Patient's Chart, lab work & pertinent test results, reviewed documented beta blocker date and time   Airway Mallampati: II  TM Distance: >3 FB Neck ROM: full    Dental no notable dental hx.    Pulmonary neg pulmonary ROS,    Pulmonary exam normal breath sounds clear to auscultation       Cardiovascular Exercise Tolerance: Good negative cardio ROS Normal cardiovascular exam Rhythm:regular Rate:Normal     Neuro/Psych negative neurological ROS  negative psych ROS   GI/Hepatic negative GI ROS, Neg liver ROS,   Endo/Other  negative endocrine ROS  Renal/GU negative Renal ROS  negative genitourinary   Musculoskeletal   Abdominal   Peds  Hematology negative hematology ROS (+)   Anesthesia Other Findings Generally healthy; no clinical complaints  Reproductive/Obstetrics negative OB ROS                             Anesthesia Physical Anesthesia Plan  ASA: II  Anesthesia Plan: General   Post-op Pain Management:    Induction:   PONV Risk Score and Plan:   Airway Management Planned:   Additional Equipment:   Intra-op Plan:   Post-operative Plan:   Informed Consent: I have reviewed the patients History and Physical, chart, labs and discussed the procedure including the risks, benefits and alternatives for the proposed anesthesia with the patient or authorized representative who has indicated his/her understanding and acceptance.   Dental Advisory Given  Plan Discussed with: CRNA  Anesthesia Plan Comments:         Anesthesia Quick Evaluation

## 2018-04-04 ENCOUNTER — Encounter (HOSPITAL_COMMUNITY): Payer: Self-pay | Admitting: General Surgery

## 2018-04-10 ENCOUNTER — Ambulatory Visit (INDEPENDENT_AMBULATORY_CARE_PROVIDER_SITE_OTHER): Payer: Self-pay | Admitting: General Surgery

## 2018-04-10 ENCOUNTER — Encounter: Payer: Self-pay | Admitting: General Surgery

## 2018-04-10 VITALS — BP 148/83 | HR 66 | Temp 99.1°F | Ht 74.0 in | Wt 224.0 lb

## 2018-04-10 DIAGNOSIS — Z09 Encounter for follow-up examination after completed treatment for conditions other than malignant neoplasm: Secondary | ICD-10-CM

## 2018-04-10 NOTE — Progress Notes (Signed)
Subjective:     Kent Roman  Status post excision of sebaceous cyst, scalp.  Patient doing well.  States his headaches are gone.  Is pleased with results. Objective:    BP (!) 148/83   Pulse 66   Temp 99.1 F (37.3 C)   Ht 6\' 2"  (1.88 m)   Wt 224 lb (101.6 kg)   BMI 28.76 kg/m   General:  alert, cooperative and no distress  Scalp incision healing well.  Sutures removed.     Assessment:    Doing well postoperatively.    Plan:   Follow-up here as needed.

## 2019-03-12 ENCOUNTER — Encounter (HOSPITAL_COMMUNITY): Payer: Self-pay

## 2019-03-12 ENCOUNTER — Emergency Department (HOSPITAL_COMMUNITY)
Admission: EM | Admit: 2019-03-12 | Discharge: 2019-03-12 | Disposition: A | Discharge status: Home / Self Care | Payer: BLUE CROSS/BLUE SHIELD | Attending: Emergency Medicine | Admitting: Emergency Medicine

## 2019-03-12 ENCOUNTER — Other Ambulatory Visit: Payer: Self-pay

## 2019-03-12 DIAGNOSIS — B349 Viral infection, unspecified: Secondary | ICD-10-CM | POA: Insufficient documentation

## 2019-03-12 DIAGNOSIS — J45909 Unspecified asthma, uncomplicated: Secondary | ICD-10-CM | POA: Insufficient documentation

## 2019-03-12 MED ORDER — IBUPROFEN 200 MG PO TABS
600.0000 mg | ORAL_TABLET | Freq: Once | ORAL | Status: AC
  Administered 2019-03-12: 600 mg via ORAL
  Filled 2019-03-12: qty 3

## 2019-03-12 NOTE — ED Provider Notes (Signed)
[facility]-EMERGENCY DEPT Provider Note   CSN: [id number] Arrival date & time: 03/12/19  9030    History   Chief Complaint Chief Complaint  Patient presents with  . Sore Throat  . Generalized Body Aches  . Cough    HPI Kent Roman is a 23 y.o. male.     HPI Patient is a 23 year old male who presents to the emergency department complaints of myalgias and sore throat over the past 3 days without difficulty breathing or swallowing.  Some runny nose and upper respiratory symptoms.  Mild cough.  No shortness of breath.  Chills and low-grade fever at home.  No recent travel.  No known contacts with COVID-19.  Otherwise healthy gentleman.  Symptoms are mild in severity.   Past Medical History:  Diagnosis Date  . Asthma     Patient Active Problem List   Diagnosis Date Noted  . Sebaceous cyst     Past Surgical History:  Procedure Laterality Date  . MASS EXCISION N/A 04/02/2018   Procedure: EXCISION 1.5 CYST SCALP;  Surgeon: Franky Macho, MD;  Location: [facility];  Service: General;  Laterality: N/A;  . WISDOM TOOTH EXTRACTION          Home Medications    Prior to Admission medications   Medication Sig Start Date End Date Taking? Authorizing Provider  guaiFENesin (MUCINEX) 600 MG 12 hr tablet Take 600 mg by mouth 2 (two) times daily as needed for cough or to loosen phlegm.   Yes [provider]    Family History Family History  Problem Relation Age of Onset  . Hypertension Other   . Cancer Other     Social History Social History   Tobacco Use  . Smoking status: Never Smoker  . Smokeless tobacco: Never Used  Substance Use Topics  . Alcohol use: No  . Drug use: No     Allergies   Patient has no known allergies.   Review of Systems Review of Systems  All other systems reviewed and are negative.    Physical Exam Updated Vital Signs BP (!) 143/101   Pulse 71   Temp 98.1 F (36.7 C) (Oral)   Resp 16   Ht 6\' 2"   (1.88 m)   Wt 101.6 kg   SpO2 100%   BMI 28.76 kg/m   Physical Exam Vitals signs and nursing note reviewed.  Constitutional:      Appearance: He is well-developed.  HENT:     Head: Normocephalic and atraumatic.     Comments: Posterior pharyngeal erythema without exudate.  Uvula midline.  No evidence of peritonsillar abscess.  Tolerating secretions. Neck:     Musculoskeletal: Normal range of motion.  Cardiovascular:     Rate and Rhythm: Normal rate and regular rhythm.     Heart sounds: Normal heart sounds.  Pulmonary:     Effort: Pulmonary effort is normal. No respiratory distress.     Breath sounds: Normal breath sounds.  Abdominal:     General: There is no distension.     Palpations: Abdomen is soft.     Tenderness: There is no abdominal tenderness.  Musculoskeletal: Normal range of motion.  Skin:    General: Skin is warm and dry.  Neurological:     Mental Status: He is alert and oriented to person, place, and time.  Psychiatric:        Judgment: Judgment normal.      ED Treatments / Results  Labs (all labs ordered are listed,  but only abnormal results are displayed) Labs Reviewed - No data to display  EKG None  Radiology No results found.  Procedures Procedures (including critical care time)  Medications Ordered in ED Medications  ibuprofen (ADVIL,MOTRIN) tablet 600 mg (600 mg Oral Given 03/12/19 0916)     Initial Impression / Assessment and Plan / ED Course  I have reviewed the triage vital signs and the nursing notes.  Pertinent labs & imaging results that were available during my care of the patient were reviewed by me and considered in my medical decision making (see chart for details).        Viral upper respiratory tract infection.  Patient given standard COVID-19 precautions including self quarantine until fever free for 72 hours and 7 days post beginning of symptoms.  Work note provided  Final Clinical Impressions(s) / ED Diagnoses   Final  diagnoses:  Viral syndrome    ED Discharge Orders    None       Azalia Bilis, MD 03/12/19 [phone]

## 2019-03-12 NOTE — ED Notes (Signed)
Pt provided water, per his request.

## 2019-03-12 NOTE — ED Triage Notes (Addendum)
Patient arrived POV.  C/O generalized body aches x3 days C/O itchy throat x 3days and sore throat x1 day  C/O dry cough Sunday-Monday that has now subsided C/O runny nose   Denies shob and able to speak in complete sentences with no problems   Patient has taken OTC Mucinex   A/Ox4 Ambulatory in triage.

## 2019-03-12 NOTE — ED Notes (Signed)
Unable to obtain 02. Patients hands are cold. Will give patient heat pack and try again.

## 2019-03-12 NOTE — Discharge Instructions (Addendum)
Take ibuprofen and tylenol for symptoms  Drink lots of fluids.   Control Measures Patients who have symptoms which could represent COVID-19 should self-isolate:      - At least 3 days (72 hours) have passed since recovery defined as resolution of fever without the use of fever-reducing medications and improvement in respiratory symptoms (e.g., cough, shortness of breath), and      -At least 7 days have passed since symptoms first appeared.

## 2020-02-08 ENCOUNTER — Emergency Department (HOSPITAL_COMMUNITY): Payer: Self-pay

## 2020-02-08 ENCOUNTER — Emergency Department (HOSPITAL_COMMUNITY): Payer: Self-pay | Admitting: Anesthesiology

## 2020-02-08 ENCOUNTER — Encounter (HOSPITAL_COMMUNITY): Payer: Self-pay | Admitting: Emergency Medicine

## 2020-02-08 ENCOUNTER — Other Ambulatory Visit: Payer: Self-pay

## 2020-02-08 ENCOUNTER — Inpatient Hospital Stay: Admit: 2020-02-08 | Payer: Self-pay | Admitting: Orthopedic Surgery

## 2020-02-08 ENCOUNTER — Ambulatory Visit (HOSPITAL_COMMUNITY)
Admission: EM | Admit: 2020-02-08 | Discharge: 2020-02-08 | Disposition: A | Discharge status: Home / Self Care | Payer: Self-pay | Attending: Emergency Medicine | Admitting: Emergency Medicine

## 2020-02-08 ENCOUNTER — Encounter (HOSPITAL_COMMUNITY)
Admission: EM | Disposition: A | Discharge status: Home / Self Care | Payer: Self-pay | Source: Home / Self Care | Attending: Emergency Medicine

## 2020-02-08 DIAGNOSIS — S62112A Displaced fracture of triquetrum [cuneiform] bone, left wrist, initial encounter for closed fracture: Secondary | ICD-10-CM | POA: Insufficient documentation

## 2020-02-08 DIAGNOSIS — S63095A Other dislocation of left wrist and hand, initial encounter: Secondary | ICD-10-CM | POA: Insufficient documentation

## 2020-02-08 DIAGNOSIS — J45909 Unspecified asthma, uncomplicated: Secondary | ICD-10-CM | POA: Insufficient documentation

## 2020-02-08 DIAGNOSIS — S52612A Displaced fracture of left ulna styloid process, initial encounter for closed fracture: Secondary | ICD-10-CM | POA: Insufficient documentation

## 2020-02-08 DIAGNOSIS — Y93I9 Activity, other involving external motion: Secondary | ICD-10-CM | POA: Insufficient documentation

## 2020-02-08 DIAGNOSIS — S52512A Displaced fracture of left radial styloid process, initial encounter for closed fracture: Secondary | ICD-10-CM | POA: Insufficient documentation

## 2020-02-08 DIAGNOSIS — Z20822 Contact with and (suspected) exposure to covid-19: Secondary | ICD-10-CM | POA: Insufficient documentation

## 2020-02-08 HISTORY — PX: ORIF WRIST FRACTURE: SHX2133

## 2020-02-08 LAB — RESPIRATORY PANEL BY RT PCR (FLU A&B, COVID)
Influenza A by PCR: NEGATIVE
Influenza B by PCR: NEGATIVE
SARS Coronavirus 2 by RT PCR: NEGATIVE

## 2020-02-08 SURGERY — OPEN REDUCTION INTERNAL FIXATION (ORIF) WRIST FRACTURE
Anesthesia: General | Site: Wrist | Laterality: Left

## 2020-02-08 MED ORDER — LACTATED RINGERS IV SOLN: INTRAVENOUS | Status: DC | PRN

## 2020-02-08 MED ORDER — HYDROCODONE-ACETAMINOPHEN 5-325 MG PO TABS
1.0000 | ORAL_TABLET | Freq: Once | ORAL | Status: AC
  Administered 2020-02-08: 17:00:00 1 via ORAL
  Filled 2020-02-08: qty 1

## 2020-02-08 MED ORDER — IBUPROFEN 400 MG PO TABS
600.0000 mg | ORAL_TABLET | Freq: Once | ORAL | Status: AC
  Administered 2020-02-08: 600 mg via ORAL
  Filled 2020-02-08: qty 2

## 2020-02-08 MED ORDER — MIDAZOLAM HCL 5 MG/5ML IJ SOLN
INTRAMUSCULAR | Status: DC | PRN
  Administered 2020-02-08: 2 mg via INTRAVENOUS

## 2020-02-08 MED ORDER — ESMOLOL HCL 100 MG/10ML IV SOLN
INTRAVENOUS | Status: DC | PRN
  Administered 2020-02-08: 30 mg via INTRAVENOUS
  Administered 2020-02-08: 20 mg via INTRAVENOUS

## 2020-02-08 MED ORDER — MIDAZOLAM HCL 2 MG/2ML IJ SOLN
INTRAMUSCULAR | Status: AC
  Filled 2020-02-08: qty 2

## 2020-02-08 MED ORDER — PROPOFOL 10 MG/ML IV BOLUS
INTRAVENOUS | Status: AC
  Filled 2020-02-08: qty 40

## 2020-02-08 MED ORDER — FENTANYL CITRATE (PF) 250 MCG/5ML IJ SOLN
INTRAMUSCULAR | Status: DC | PRN
  Administered 2020-02-08: 50 ug via INTRAVENOUS
  Administered 2020-02-08 (×2): 100 ug via INTRAVENOUS

## 2020-02-08 MED ORDER — OXYCODONE-ACETAMINOPHEN 10-325 MG PO TABS: 1.0000 | ORAL_TABLET | Freq: Four times a day (QID) | ORAL | 0 refills | Status: DC | PRN

## 2020-02-08 MED ORDER — PROPOFOL 10 MG/ML IV BOLUS
INTRAVENOUS | Status: DC | PRN
  Administered 2020-02-08: 200 mg via INTRAVENOUS

## 2020-02-08 MED ORDER — FENTANYL CITRATE (PF) 250 MCG/5ML IJ SOLN
INTRAMUSCULAR | Status: AC
  Filled 2020-02-08: qty 5

## 2020-02-08 MED ORDER — SUCCINYLCHOLINE CHLORIDE 20 MG/ML IJ SOLN
INTRAMUSCULAR | Status: DC | PRN
  Administered 2020-02-08: 120 mg via INTRAVENOUS

## 2020-02-08 MED ORDER — ONDANSETRON HCL 4 MG/2ML IJ SOLN
INTRAMUSCULAR | Status: DC | PRN
  Administered 2020-02-08: 4 mg via INTRAVENOUS

## 2020-02-08 MED ORDER — LIDOCAINE HCL (CARDIAC) PF 100 MG/5ML IV SOSY
PREFILLED_SYRINGE | INTRAVENOUS | Status: DC | PRN
  Administered 2020-02-08: 60 mg via INTRATRACHEAL

## 2020-02-08 SURGICAL SUPPLY — 7 items
BNDG ELASTIC 3X5.8 VLCR STR LF (GAUZE/BANDAGES/DRESSINGS) ×3 IMPLANT
BNDG GAUZE ELAST 4 BULKY (GAUZE/BANDAGES/DRESSINGS) ×3 IMPLANT
KIT TURNOVER KIT B (KITS) ×3 IMPLANT
PAD ARMBOARD 7.5X6 YLW CONV (MISCELLANEOUS) ×6 IMPLANT
PAD CAST 4YDX4 CTTN HI CHSV (CAST SUPPLIES) ×1 IMPLANT
PADDING CAST COTTON 4X4 STRL (CAST SUPPLIES) ×3
SPLINT FIBERGLASS 4X30 (CAST SUPPLIES) ×6 IMPLANT
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
## 2020-02-08 NOTE — ED Provider Notes (Signed)
Pt transferred from [facility] ED after being seen for left wrist injury and found to have a perilunate carpal dislocation and multiple fracture fragments of the wrist. Plan for OR with Dr. Melvyn Novas. 2 hour COVID test obtained here on arrival per Dr. Glenna Durand request. Triage has paged the OR and they are aware patient is here.    Tanda Rockers, PA-C 02/09/20 0013    Blane Ohara, MD 02/09/20 2351

## 2020-02-08 NOTE — ED Notes (Signed)
ED Provider at bedside. 

## 2020-02-08 NOTE — Op Note (Signed)
PREOPERATIVE DIAGNOSIS: Left wrist transstyloid perilunate fracture dislocation  POSTOPERATIVE DIAGNOSIS: Same  ATTENDING SURGEON: Dr. Bradly Bienenstock who was present for the entire procedure  ASSISTANT SURGEON: None  ANESTHESIA: General via endotracheal tube  OPERATIVE PROCEDURE: Closed manipulation left wrist perilunate dislocation requiring anesthesia Radiographs 3 views left wrist.  IMPLANTS: None  RADIOGRAPHIC INTERPRETATION: [facility] lateral and oblique views of the wrist do show the reduction of the capitate on the lunate with the associated fractures to this radial styloid and ulnar styloid and triquetrum  SURGICAL INDICATIONS: Patient is a right-hand-dominant gentleman who sustained the perilunate dislocation.  Patient was seen and evaluated and recommended undergo closed manipulation.  Risk benefits alternatives were discussed in detail with the patient and a signed informed consent was obtained.  SURGICAL TECHNIQUE: Patient was palpated via the preoperative holding area marked apart a marker made on the left wrist indicate the correct operative site.  Patient brought back operating placed supine on anesthesia table where the general anesthetic was administered.  Patient tolerated this well.  Close manipulation was successful in reduction of the capitate on the lunate.  Following reduction patient was placed in a well-padded sugar tong splint.  Final radiographs were then obtained.  Patient was then extubated taken recovery in good condition.  POSTOPERATIVE PLAN: Patient be discharged to home.  See him back in the office in 3 days we will schedule surgical reconstruction of the wrist in the coming week.  The patient is going to need stabilization of the scapholunate ligament interval.  Patient is also going to need wrist reconstruction to stabilize given the highly unstable perilunate transstyloid fracture dislocation.

## 2020-02-08 NOTE — Transfer of Care (Signed)
Immediate Anesthesia Transfer of Care Note  Patient: [name]  Procedure(s) Performed: CLOSED REDUCTION WRIST FRACTURE (Left Wrist)  Patient Location: PACU  Anesthesia Type:General  Level of Consciousness: awake  Airway & Oxygen Therapy: Patient Spontanous Breathing  Post-op Assessment: Report given to RN and Post -op Vital signs reviewed and stable  Post vital signs: Reviewed and stable  Last Vitals:  Vitals Value Taken Time  BP 154/106 02/08/20 2147  Temp    Pulse 123 02/08/20 2149  Resp 17 02/08/20 2149  SpO2 99 % 02/08/20 2149  Vitals shown include unvalidated device data.  Last Pain:  Vitals:   02/08/20 2147  TempSrc:   PainSc: (P) 0-No pain         Complications: No apparent anesthesia complications

## 2020-02-08 NOTE — ED Notes (Signed)
Pt back from CT

## 2020-02-08 NOTE — Anesthesia Preprocedure Evaluation (Signed)
Anesthesia Evaluation  Patient identified by MRN, date of birth, ID band Patient awake    Reviewed: Allergy & Precautions, NPO status , Patient's Chart, lab work & pertinent test results  Airway Mallampati: I  TM Distance: >3 FB Neck ROM: Full    Dental   Pulmonary    Pulmonary exam normal        Cardiovascular Normal cardiovascular exam     Neuro/Psych    GI/Hepatic   Endo/Other    Renal/GU      Musculoskeletal   Abdominal   Peds  Hematology   Anesthesia Other Findings   Reproductive/Obstetrics                             Anesthesia Physical Anesthesia Plan  ASA: II and emergent  Anesthesia Plan: General   Post-op Pain Management:    Induction: Intravenous, Rapid sequence and Cricoid pressure planned  PONV Risk Score and Plan: 2 and Ondansetron and Midazolam  Airway Management Planned: Oral ETT  Additional Equipment:   Intra-op Plan:   Post-operative Plan: Extubation in OR  Informed Consent: I have reviewed the patients History and Physical, chart, labs and discussed the procedure including the risks, benefits and alternatives for the proposed anesthesia with the patient or authorized representative who has indicated his/her understanding and acceptance.       Plan Discussed with: CRNA and Surgeon  Anesthesia Plan Comments:         Anesthesia Quick Evaluation

## 2020-02-08 NOTE — ED Notes (Signed)
Called to verify lab has covid swab.
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
## 2020-02-08 NOTE — Anesthesia Procedure Notes (Signed)
Procedure Name: Intubation Date/Time: 02/08/2020 9:33 PM Performed by: Claudina Lick, CRNA Pre-anesthesia Checklist: Patient identified, Emergency Drugs available, Suction available, Patient being monitored and Timeout performed Patient Re-evaluated:Patient Re-evaluated prior to induction Oxygen Delivery Method: Circle system utilized Preoxygenation: Pre-oxygenation with 100% oxygen Induction Type: IV induction, Rapid sequence and Cricoid Pressure applied Laryngoscope Size: Miller and 2 Grade View: Grade I Tube type: Oral Tube size: 7.5 mm Number of attempts: 1 Airway Equipment and Method: Stylet Placement Confirmation: ETT inserted through vocal cords under direct vision,  positive ETCO2 and breath sounds checked- equal and bilateral Secured at: 22 cm Tube secured with: Tape Dental Injury: Teeth and Oropharynx as per pre-operative assessment

## 2020-02-08 NOTE — ED Notes (Signed)
Pt to CT

## 2020-02-08 NOTE — Discharge Instructions (Addendum)
KEEP BANDAGE CLEAN AND DRY CALL OFFICE FOR F/U APPT [phone] in 3 days KEEP HAND ELEVATED ABOVE HEART OK TO APPLY ICE TO OPERATIVE AREA CONTACT OFFICE IF ANY WORSENING PAIN OR CONCERNS. 
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
## 2020-02-08 NOTE — Anesthesia Postprocedure Evaluation (Signed)
Anesthesia Post Note  Patient: Advertising account planner  Procedure(s) Performed: CLOSED REDUCTION WRIST FRACTURE (Left Wrist)     Patient location during evaluation: PACU Anesthesia Type: General Level of consciousness: awake and alert Pain management: pain level controlled Vital Signs Assessment: post-procedure vital signs reviewed and stable Respiratory status: spontaneous breathing, nonlabored ventilation, respiratory function stable and patient connected to nasal cannula oxygen Cardiovascular status: blood pressure returned to baseline and stable Postop Assessment: no apparent nausea or vomiting Anesthetic complications: no    Last Vitals:  Vitals:   02/08/20 2203 02/08/20 2206  BP: (!) 151/96 (!) 149/92  Pulse: (!) 107 (!) 101  Resp: 19 17  Temp:  (P) 36.9 C  SpO2: 98% 100%    Last Pain:  Vitals:   02/08/20 2147  TempSrc:   PainSc: 0-No pain                 Nasiir Monts DAVID

## 2020-02-11 ENCOUNTER — Other Ambulatory Visit (HOSPITAL_COMMUNITY)
Admission: RE | Admit: 2020-02-11 | Discharge: 2020-02-11 | Disposition: A | Discharge status: Home / Self Care | Payer: HRSA Program | Source: Ambulatory Visit | Attending: Orthopedic Surgery | Admitting: Orthopedic Surgery

## 2020-02-11 ENCOUNTER — Encounter (HOSPITAL_BASED_OUTPATIENT_CLINIC_OR_DEPARTMENT_OTHER): Payer: Self-pay | Admitting: Orthopedic Surgery

## 2020-02-11 ENCOUNTER — Other Ambulatory Visit: Payer: Self-pay

## 2020-02-11 DIAGNOSIS — Z01812 Encounter for preprocedural laboratory examination: Secondary | ICD-10-CM | POA: Insufficient documentation

## 2020-02-11 DIAGNOSIS — Z20822 Contact with and (suspected) exposure to covid-19: Secondary | ICD-10-CM | POA: Insufficient documentation

## 2020-02-11 LAB — SARS CORONAVIRUS 2 (TAT 6-24 HRS): SARS Coronavirus 2: NEGATIVE
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
## 2020-02-12 ENCOUNTER — Ambulatory Visit (HOSPITAL_BASED_OUTPATIENT_CLINIC_OR_DEPARTMENT_OTHER): Payer: Self-pay | Admitting: Anesthesiology

## 2020-02-12 ENCOUNTER — Observation Stay (HOSPITAL_BASED_OUTPATIENT_CLINIC_OR_DEPARTMENT_OTHER)
Admission: RE | Admit: 2020-02-12 | Discharge: 2020-02-13 | Disposition: A | Discharge status: Home / Self Care | Payer: Self-pay | Attending: Orthopedic Surgery | Admitting: Orthopedic Surgery

## 2020-02-12 ENCOUNTER — Encounter (HOSPITAL_BASED_OUTPATIENT_CLINIC_OR_DEPARTMENT_OTHER)
Admission: RE | Disposition: A | Discharge status: Home / Self Care | Payer: Self-pay | Source: Home / Self Care | Attending: Orthopedic Surgery

## 2020-02-12 ENCOUNTER — Encounter (HOSPITAL_BASED_OUTPATIENT_CLINIC_OR_DEPARTMENT_OTHER): Payer: Self-pay | Admitting: Orthopedic Surgery

## 2020-02-12 ENCOUNTER — Other Ambulatory Visit: Payer: Self-pay

## 2020-02-12 DIAGNOSIS — S62122A Displaced fracture of lunate [semilunar], left wrist, initial encounter for closed fracture: Secondary | ICD-10-CM | POA: Diagnosis present

## 2020-02-12 DIAGNOSIS — J45909 Unspecified asthma, uncomplicated: Secondary | ICD-10-CM | POA: Insufficient documentation

## 2020-02-12 DIAGNOSIS — S63095A Other dislocation of left wrist and hand, initial encounter: Secondary | ICD-10-CM

## 2020-02-12 DIAGNOSIS — S62111A Displaced fracture of triquetrum [cuneiform] bone, right wrist, initial encounter for closed fracture: Principal | ICD-10-CM | POA: Insufficient documentation

## 2020-02-12 HISTORY — PX: LIGAMENT REPAIR: SHX5444

## 2020-02-12 HISTORY — PX: ORIF WRIST FRACTURE: SHX2133

## 2020-02-12 SURGERY — OPEN REDUCTION INTERNAL FIXATION (ORIF) WRIST FRACTURE
Anesthesia: General | Site: Wrist | Laterality: Left

## 2020-02-12 MED ORDER — FENTANYL CITRATE (PF) 100 MCG/2ML IJ SOLN
50.0000 ug | INTRAMUSCULAR | Status: DC | PRN
  Administered 2020-02-12: 100 ug via INTRAVENOUS

## 2020-02-12 MED ORDER — POTASSIUM CHLORIDE IN NACL 20-0.45 MEQ/L-% IV SOLN: INTRAVENOUS | Status: DC

## 2020-02-12 MED ORDER — MIDAZOLAM HCL 2 MG/2ML IJ SOLN
INTRAMUSCULAR | Status: AC
  Filled 2020-02-12: qty 2

## 2020-02-12 MED ORDER — HYDROMORPHONE HCL 1 MG/ML IJ SOLN
0.5000 mg | INTRAMUSCULAR | Status: DC | PRN
  Administered 2020-02-13 (×2): 0.5 mg via INTRAVENOUS

## 2020-02-12 MED ORDER — ACETAMINOPHEN 500 MG PO TABS
1000.0000 mg | ORAL_TABLET | Freq: Four times a day (QID) | ORAL | Status: DC
  Administered 2020-02-12 – 2020-02-13 (×3): 1000 mg via ORAL
  Filled 2020-02-12 (×3): qty 2

## 2020-02-12 MED ORDER — CEFAZOLIN SODIUM-DEXTROSE 2-4 GM/100ML-% IV SOLN: 2.0000 g | INTRAVENOUS | Status: AC

## 2020-02-12 MED ORDER — HYDROMORPHONE HCL 1 MG/ML IJ SOLN: 0.2500 mg | INTRAMUSCULAR | Status: DC | PRN

## 2020-02-12 MED ORDER — DEXAMETHASONE SODIUM PHOSPHATE 10 MG/ML IJ SOLN
INTRAMUSCULAR | Status: AC
  Filled 2020-02-12: qty 1

## 2020-02-12 MED ORDER — FENTANYL CITRATE (PF) 100 MCG/2ML IJ SOLN
INTRAMUSCULAR | Status: AC
  Filled 2020-02-12: qty 2

## 2020-02-12 MED ORDER — OXYCODONE HCL 5 MG/5ML PO SOLN: 5.0000 mg | Freq: Once | ORAL | Status: DC | PRN

## 2020-02-12 MED ORDER — METHOCARBAMOL 1000 MG/10ML IJ SOLN: 500.0000 mg | Freq: Four times a day (QID) | INTRAVENOUS | Status: DC | PRN

## 2020-02-12 MED ORDER — OXYCODONE HCL 5 MG PO TABS
5.0000 mg | ORAL_TABLET | ORAL | Status: DC | PRN
  Administered 2020-02-13 (×2): 10 mg via ORAL
  Filled 2020-02-12 (×2): qty 2

## 2020-02-12 MED ORDER — CHLORHEXIDINE GLUCONATE 4 % EX LIQD: 60.0000 mL | Freq: Once | CUTANEOUS | Status: DC

## 2020-02-12 MED ORDER — LIDOCAINE 2% (20 MG/ML) 5 ML SYRINGE
INTRAMUSCULAR | Status: DC | PRN
  Administered 2020-02-12: 40 mg via INTRAVENOUS

## 2020-02-12 MED ORDER — MEPERIDINE HCL 25 MG/ML IJ SOLN: 6.2500 mg | INTRAMUSCULAR | Status: DC | PRN

## 2020-02-12 MED ORDER — PROMETHAZINE HCL 25 MG/ML IJ SOLN: 6.2500 mg | INTRAMUSCULAR | Status: DC | PRN

## 2020-02-12 MED ORDER — BISACODYL 5 MG PO TBEC: 5.0000 mg | DELAYED_RELEASE_TABLET | Freq: Every day | ORAL | Status: DC | PRN

## 2020-02-12 MED ORDER — OXYCODONE HCL 5 MG PO TABS: 5.0000 mg | ORAL_TABLET | Freq: Once | ORAL | Status: DC | PRN

## 2020-02-12 MED ORDER — SENNOSIDES-DOCUSATE SODIUM 8.6-50 MG PO TABS: 1.0000 | ORAL_TABLET | Freq: Every evening | ORAL | Status: DC | PRN

## 2020-02-12 MED ORDER — ACETAMINOPHEN 325 MG PO TABS: 325.0000 mg | ORAL_TABLET | Freq: Four times a day (QID) | ORAL | Status: DC | PRN

## 2020-02-12 MED ORDER — LACTATED RINGERS IV SOLN: INTRAVENOUS | Status: DC

## 2020-02-12 MED ORDER — METHOCARBAMOL 500 MG PO TABS
500.0000 mg | ORAL_TABLET | Freq: Four times a day (QID) | ORAL | Status: DC | PRN
  Administered 2020-02-13: 500 mg via ORAL
  Filled 2020-02-12: qty 1

## 2020-02-12 MED ORDER — ONDANSETRON HCL 4 MG/2ML IJ SOLN
INTRAMUSCULAR | Status: DC | PRN
  Administered 2020-02-12: 4 mg via INTRAVENOUS

## 2020-02-12 MED ORDER — LIDOCAINE 2% (20 MG/ML) 5 ML SYRINGE
INTRAMUSCULAR | Status: AC
  Filled 2020-02-12: qty 5

## 2020-02-12 MED ORDER — MAGNESIUM CITRATE PO SOLN: 1.0000 | Freq: Once | ORAL | Status: DC | PRN

## 2020-02-12 MED ORDER — MIDAZOLAM HCL 2 MG/2ML IJ SOLN
1.0000 mg | INTRAMUSCULAR | Status: DC | PRN
  Administered 2020-02-12: 2 mg via INTRAVENOUS

## 2020-02-12 MED ORDER — ONDANSETRON HCL 4 MG PO TABS: 4.0000 mg | ORAL_TABLET | Freq: Four times a day (QID) | ORAL | Status: DC | PRN

## 2020-02-12 MED ORDER — PROPOFOL 10 MG/ML IV BOLUS
INTRAVENOUS | Status: AC
  Filled 2020-02-12: qty 20

## 2020-02-12 MED ORDER — ROPIVACAINE HCL 5 MG/ML IJ SOLN
INTRAMUSCULAR | Status: DC | PRN
  Administered 2020-02-12: 30 mL via PERINEURAL

## 2020-02-12 MED ORDER — ONDANSETRON HCL 4 MG/2ML IJ SOLN: 4.0000 mg | Freq: Four times a day (QID) | INTRAMUSCULAR | Status: DC | PRN

## 2020-02-12 MED ORDER — CEFAZOLIN SODIUM-DEXTROSE 2-4 GM/100ML-% IV SOLN
INTRAVENOUS | Status: AC
  Filled 2020-02-12: qty 100

## 2020-02-12 MED ORDER — DIPHENHYDRAMINE HCL 25 MG PO CAPS: 25.0000 mg | ORAL_CAPSULE | Freq: Four times a day (QID) | ORAL | Status: DC | PRN

## 2020-02-12 MED ORDER — ONDANSETRON HCL 4 MG/2ML IJ SOLN
INTRAMUSCULAR | Status: AC
  Filled 2020-02-12: qty 2

## 2020-02-12 MED ORDER — PROPOFOL 10 MG/ML IV BOLUS
INTRAVENOUS | Status: DC | PRN
  Administered 2020-02-12: 200 mg via INTRAVENOUS
  Administered 2020-02-12: 100 mg via INTRAVENOUS

## 2020-02-12 MED ORDER — POVIDONE-IODINE 10 % EX SWAB
2.0000 "application " | Freq: Once | CUTANEOUS | Status: AC
  Administered 2020-02-12: 2 via TOPICAL

## 2020-02-12 MED ORDER — FAMOTIDINE 20 MG PO TABS: 20.0000 mg | ORAL_TABLET | Freq: Two times a day (BID) | ORAL | Status: DC | PRN

## 2020-02-12 MED ORDER — DOCUSATE SODIUM 100 MG PO CAPS: 100.0000 mg | ORAL_CAPSULE | Freq: Two times a day (BID) | ORAL | Status: DC

## 2020-02-12 MED ORDER — OXYCODONE HCL 5 MG PO TABS: 10.0000 mg | ORAL_TABLET | ORAL | Status: DC | PRN

## 2020-02-12 MED ORDER — DEXAMETHASONE SODIUM PHOSPHATE 10 MG/ML IJ SOLN
INTRAMUSCULAR | Status: DC | PRN
  Administered 2020-02-12: 5 mg via INTRAVENOUS

## 2020-02-12 SURGICAL SUPPLY — 77 items
ANCH SUT 2-0 3/8 CRC MIC FT 4 (Anchor) ×8 IMPLANT
ANCHOR FT CORKSCREW MICRO 2-0 (Anchor) ×8 IMPLANT
APL SKNCLS STERI-STRIP NONHPOA (GAUZE/BANDAGES/DRESSINGS) ×1
BENZOIN TINCTURE PRP APPL 2/3 (GAUZE/BANDAGES/DRESSINGS) ×4 IMPLANT
BLADE SURG 15 STRL LF DISP TIS (BLADE) ×2 IMPLANT
BLADE SURG 15 STRL SS (BLADE) ×3
BNDG CMPR 9X4 STRL LF SNTH (GAUZE/BANDAGES/DRESSINGS) ×1
BNDG COHESIVE 4X5 TAN STRL (GAUZE/BANDAGES/DRESSINGS) ×4 IMPLANT
BNDG CONFORM 3 STRL LF (GAUZE/BANDAGES/DRESSINGS) IMPLANT
BNDG ELASTIC 4X5.8 VLCR STR LF (GAUZE/BANDAGES/DRESSINGS) ×8 IMPLANT
BNDG ESMARK 4X9 LF (GAUZE/BANDAGES/DRESSINGS) ×4 IMPLANT
BNDG GAUZE ELAST 4 BULKY (GAUZE/BANDAGES/DRESSINGS) ×4 IMPLANT
CANISTER SUCT 1200ML W/VALVE (MISCELLANEOUS) ×4 IMPLANT
CLOSURE WOUND 1/2 X4 (GAUZE/BANDAGES/DRESSINGS) ×1
CORD BIPOLAR FORCEPS 12FT (ELECTRODE) IMPLANT
COVER BACK TABLE 60X90IN (DRAPES) ×4 IMPLANT
COVER WAND RF STERILE (DRAPES) IMPLANT
CUFF TOURN SGL QUICK 18X4 (TOURNIQUET CUFF) ×4 IMPLANT
CUFF TOURN SGL QUICK 24 (TOURNIQUET CUFF)
CUFF TRNQT CYL 24X4X16.5-23 (TOURNIQUET CUFF) IMPLANT
DECANTER SPIKE VIAL GLASS SM (MISCELLANEOUS) IMPLANT
DRAPE EXTREMITY T 121X128X90 (DISPOSABLE) ×4 IMPLANT
DRAPE OEC MINIVIEW 54X84 (DRAPES) ×4 IMPLANT
ELECT NDL TIP 2.8 STRL (NEEDLE) IMPLANT
ELECT NEEDLE TIP 2.8 STRL (NEEDLE) IMPLANT
ELECT REM PT RETURN 9FT ADLT (ELECTROSURGICAL)
ELECTRODE REM PT RTRN 9FT ADLT (ELECTROSURGICAL) IMPLANT
GAUZE SPONGE 4X4 12PLY STRL (GAUZE/BANDAGES/DRESSINGS) ×4 IMPLANT
GLOVE BIOGEL PI IND STRL 6.5 (GLOVE) IMPLANT
GLOVE BIOGEL PI IND STRL 8 (GLOVE) IMPLANT
GLOVE BIOGEL PI IND STRL 8.5 (GLOVE) ×2 IMPLANT
GLOVE BIOGEL PI INDICATOR 6.5 (GLOVE) ×2
GLOVE BIOGEL PI INDICATOR 8 (GLOVE) ×2
GLOVE BIOGEL PI INDICATOR 8.5 (GLOVE) ×2
GLOVE ECLIPSE 6.5 STRL STRAW (GLOVE) ×4 IMPLANT
GLOVE SURG ORTHO 8.0 STRL STRW (GLOVE) ×4 IMPLANT
GOWN STRL REUS W/ TWL XL LVL3 (GOWN DISPOSABLE) ×2 IMPLANT
GOWN STRL REUS W/TWL XL LVL3 (GOWN DISPOSABLE) ×3
K-WIRE .045X4 (WIRE) ×18 IMPLANT
K-WIRE .062X4 (WIRE) ×4 IMPLANT
NDL HYPO 25X1 1.5 SAFETY (NEEDLE) ×2 IMPLANT
NEEDLE HYPO 25X1 1.5 SAFETY (NEEDLE) ×4 IMPLANT
NS IRRIG 1000ML POUR BTL (IV SOLUTION) ×4 IMPLANT
PACK BASIN DAY SURGERY FS (CUSTOM PROCEDURE TRAY) ×4 IMPLANT
PAD CAST 4YDX4 CTTN HI CHSV (CAST SUPPLIES) ×4 IMPLANT
PADDING CAST ABS 4INX4YD NS (CAST SUPPLIES) ×2
PADDING CAST ABS COTTON 4X4 ST (CAST SUPPLIES) ×2 IMPLANT
PADDING CAST COTTON 4X4 STRL (CAST SUPPLIES) ×8
PENCIL SMOKE EVACUATOR (MISCELLANEOUS) IMPLANT
SLEEVE SCD COMPRESS KNEE MED (MISCELLANEOUS) ×4 IMPLANT
SLING ARM FOAM STRAP LRG (SOFTGOODS) IMPLANT
SLING ARM FOAM STRAP XLG (SOFTGOODS) ×4 IMPLANT
SPLINT FIBERGLASS 3X35 (CAST SUPPLIES) ×4 IMPLANT
SPLINT FIBERGLASS 4X30 (CAST SUPPLIES) IMPLANT
STOCKINETTE 4X48 STRL (DRAPES) ×4 IMPLANT
STRIP CLOSURE SKIN 1/2X4 (GAUZE/BANDAGES/DRESSINGS) ×3 IMPLANT
SUCTION FRAZIER HANDLE 10FR (MISCELLANEOUS) ×2
SUCTION TUBE FRAZIER 10FR DISP (MISCELLANEOUS) ×2 IMPLANT
SUT MNCRL AB 3-0 PS2 18 (SUTURE) ×4 IMPLANT
SUT MON AB 3-0 SH 27 (SUTURE)
SUT MON AB 3-0 SH27 (SUTURE) IMPLANT
SUT PROLENE 3 0 PS 1 (SUTURE) IMPLANT
SUT PROLENE 4 0 PS 2 18 (SUTURE) ×2 IMPLANT
SUT VIC AB 0 CT1 27 (SUTURE)
SUT VIC AB 0 CT1 27XBRD ANBCTR (SUTURE) IMPLANT
SUT VIC AB 2-0 PS2 27 (SUTURE) IMPLANT
SUT VIC AB 2-0 SH 27 (SUTURE) ×3
SUT VIC AB 2-0 SH 27XBRD (SUTURE) IMPLANT
SUT VICRYL 4-0 PS2 18IN ABS (SUTURE) IMPLANT
SYR BULB 3OZ (MISCELLANEOUS) ×4 IMPLANT
SYR CONTROL 10ML LL (SYRINGE) ×4 IMPLANT
TOWEL GREEN STERILE FF (TOWEL DISPOSABLE) ×4 IMPLANT
TRAP DIGIT (INSTRUMENTS) ×4 IMPLANT
TRAY DSU PREP LF (CUSTOM PROCEDURE TRAY) ×4 IMPLANT
TUBE CONNECTING 20'X1/4 (TUBING) ×1
TUBE CONNECTING 20X1/4 (TUBING) ×3 IMPLANT
UNDERPAD 30X36 HEAVY ABSORB (UNDERPADS AND DIAPERS) ×4 IMPLANT

## 2020-02-12 NOTE — Anesthesia Procedure Notes (Signed)
Anesthesia Regional Block: Supraclavicular block   Pre-Anesthetic Checklist: ,, timeout performed, Correct Patient, Correct Site, Correct Laterality, Correct Procedure, Correct Position, site marked, Risks and benefits discussed,  Surgical consent,  Pre-op evaluation,  At surgeon's request and post-op pain management  Laterality: Left  Prep: chloraprep       Needles:  Injection technique: Single-shot  Needle Type: Stimiplex     Needle Length: 9cm  Needle Gauge: 21     Additional Needles:   Procedures:,,,, ultrasound used (permanent image in chart),,,,  Narrative:  Start time: 02/12/2020 12:27 PM End time: 02/12/2020 12:32 PM Injection made incrementally with aspirations every 5 mL.  Performed by: Personally  Anesthesiologist: Lowella Curb, MD

## 2020-02-12 NOTE — Progress Notes (Signed)
Assisted Dr. Miller with left, ultrasound guided, supraclavicular block. Side rails up, monitors on throughout procedure. See vital signs in flow sheet. Tolerated Procedure well. 

## 2020-02-12 NOTE — Anesthesia Procedure Notes (Signed)
Procedure Name: LMA Insertion Date/Time: 02/12/2020 1:19 PM Performed by: Burna Cash, CRNA Pre-anesthesia Checklist: Patient identified, Emergency Drugs available, Suction available and Patient being monitored Patient Re-evaluated:Patient Re-evaluated prior to induction Oxygen Delivery Method: Circle system utilized Preoxygenation: Pre-oxygenation with 100% oxygen Induction Type: IV induction Ventilation: Mask ventilation without difficulty LMA: LMA inserted LMA Size: 5.0 Number of attempts: 1 Airway Equipment and Method: Bite block Placement Confirmation: positive ETCO2 Tube secured with: Tape Dental Injury: Teeth and Oropharynx as per pre-operative assessment

## 2020-02-12 NOTE — Anesthesia Preprocedure Evaluation (Signed)
Anesthesia Evaluation  Patient identified by MRN, date of birth, ID band Patient awake    Reviewed: Allergy & Precautions, NPO status , Patient's Chart, lab work & pertinent test results  Airway Mallampati: I  TM Distance: >3 FB Neck ROM: Full    Dental no notable dental hx.    Pulmonary asthma ,    Pulmonary exam normal breath sounds clear to auscultation       Cardiovascular Normal cardiovascular exam Rhythm:Regular Rate:Normal     Neuro/Psych    GI/Hepatic   Endo/Other    Renal/GU      Musculoskeletal   Abdominal   Peds  Hematology   Anesthesia Other Findings   Reproductive/Obstetrics                             Anesthesia Physical  Anesthesia Plan  ASA: II  Anesthesia Plan: General   Post-op Pain Management:  Regional for Post-op pain   Induction: Intravenous  PONV Risk Score and Plan: 2 and Ondansetron, Midazolam and Treatment may vary due to age or medical condition  Airway Management Planned: LMA  Additional Equipment:   Intra-op Plan:   Post-operative Plan: Extubation in OR  Informed Consent: I have reviewed the patients History and Physical, chart, labs and discussed the procedure including the risks, benefits and alternatives for the proposed anesthesia with the patient or authorized representative who has indicated his/her understanding and acceptance.       Plan Discussed with: CRNA and Surgeon  Anesthesia Plan Comments:         Anesthesia Quick Evaluation
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
## 2020-02-12 NOTE — Transfer of Care (Signed)
Immediate Anesthesia Transfer of Care Note  Patient: [name]  Procedure(s) Performed: WRIST LIGAMENTOUS RECONSTRUCTION AND TENDON/LIGAMENT GRAFTING RECONSTRUCTION AS INDICATED (Left Wrist) LIGAMENT REPAIR (Wrist)  Patient Location: PACU  Anesthesia Type:GA combined with regional for post-op pain  Level of Consciousness: sedated  Airway & Oxygen Therapy: Patient Spontanous Breathing and Patient connected to nasal cannula oxygen  Post-op Assessment: Report given to RN and Post -op Vital signs reviewed and stable  Post vital signs: Reviewed and stable  Last Vitals:  Vitals Value Taken Time  BP 115/61 02/12/20 1604  Temp    Pulse 114 02/12/20 1605  Resp 23 02/12/20 1605  SpO2 99 % 02/12/20 1605  Vitals shown include unvalidated device data.  Last Pain:  Vitals:   02/12/20 1104  TempSrc: Oral  PainSc: 0-No pain      Patients Stated Pain Goal: 0 (02/12/20 1104)  Complications: No apparent anesthesia complications

## 2020-02-13 ENCOUNTER — Encounter: Payer: Self-pay | Admitting: *Deleted

## 2020-02-13 MED ORDER — LACTATED RINGERS IV SOLN: INTRAVENOUS | Status: DC

## 2020-02-13 MED ORDER — HYDROMORPHONE HCL 1 MG/ML IJ SOLN
INTRAMUSCULAR | Status: AC
  Filled 2020-02-13: qty 0.5

## 2020-02-13 MED ORDER — HYDROMORPHONE HCL 1 MG/ML IJ SOLN
INTRAMUSCULAR | Status: AC
  Filled 2020-02-13: qty 1

## 2020-02-13 MED ORDER — HYDROMORPHONE HCL 1 MG/ML IJ SOLN
1.0000 mg | Freq: Once | INTRAMUSCULAR | Status: AC
  Administered 2020-02-13: 1 mg via INTRAVENOUS

## 2020-02-13 NOTE — Anesthesia Postprocedure Evaluation (Signed)
Anesthesia Post Note  Patient: Advertising account planner  Procedure(s) Performed: WRIST LIGAMENTOUS RECONSTRUCTION AND TENDON/LIGAMENT GRAFTING RECONSTRUCTION AS INDICATED (Left Wrist) LIGAMENT REPAIR (Wrist)     Patient location during evaluation: PACU Anesthesia Type: General Level of consciousness: awake and alert Pain management: pain level controlled Vital Signs Assessment: post-procedure vital signs reviewed and stable Respiratory status: spontaneous breathing, nonlabored ventilation and respiratory function stable Cardiovascular status: blood pressure returned to baseline and stable Postop Assessment: no apparent nausea or vomiting Anesthetic complications: no    Last Vitals:  Vitals:   02/13/20 0700 02/13/20 0800  BP: (!) 145/100 (!) 168/105  Pulse: 62 65  Resp: 18 20  Temp:    SpO2: 97% 97%    Last Pain:  Vitals:   02/13/20 0800  TempSrc:   PainSc: 4    Pain Goal: Patients Stated Pain Goal: 0 (02/12/20 1104)                 Lowella Curb

## 2020-02-13 NOTE — Discharge Instructions (Signed)
KEEP BANDAGE CLEAN AND DRY CALL OFFICE FOR F/U APPT [phone] in 2 weeks KEEP HAND ELEVATED ABOVE HEART OK TO APPLY ICE TO OPERATIVE AREA CONTACT OFFICE IF ANY WORSENING PAIN OR CONCERNS.   Post Anesthesia Home Care Instructions  Activity: Get plenty of rest for the remainder of the day. A responsible individual must stay with you for 24 hours following the procedure.  For the next 24 hours, DO NOT: -Drive a car -Advertising copywriter -Drink alcoholic beverages -Take any medication unless instructed by your physician -Make any legal decisions or sign important papers.  Meals: Start with liquid foods such as gelatin or soup. Progress to regular foods as tolerated. Avoid greasy, spicy, heavy foods. If nausea and/or vomiting occur, drink only clear liquids until the nausea and/or vomiting subsides. Call your physician if vomiting continues.  Special Instructions/Symptoms: Your throat may feel dry or sore from the anesthesia or the breathing tube placed in your throat during surgery. If this causes discomfort, gargle with warm salt water. The discomfort should disappear within 24 hours.  If you had a scopolamine patch placed behind your ear for the management of post- operative nausea and/or vomiting:  1. The medication in the patch is effective for 72 hours, after which it should be removed.  Wrap patch in a tissue and discard in the trash. Wash hands thoroughly with soap and water. 2. You may remove the patch earlier than 72 hours if you experience unpleasant side effects which may include dry mouth, dizziness or visual disturbances. 3. Avoid touching the patch. Wash your hands with soap and water after contact with the patch.     Regional Anesthesia Blocks  1. Numbness or the inability to move the "blocked" extremity may last from 3-48 hours after placement. The length of time depends on the medication injected and your individual response to the medication. If the numbness is not going  away after 48 hours, call your surgeon.  2. The extremity that is blocked will need to be protected until the numbness is gone and the  Strength has returned. Because you cannot feel it, you will need to take extra care to avoid injury. Because it may be weak, you may have difficulty moving it or using it. You may not know what position it is in without looking at it while the block is in effect.  3. For blocks in the legs and feet, returning to weight bearing and walking needs to be done carefully. You will need to wait until the numbness is entirely gone and the strength has returned. You should be able to move your leg and foot normally before you try and bear weight or walk. You will need someone to be with you when you first try to ensure you do not fall and possibly risk injury.  4. Bruising and tenderness at the needle site are common side effects and will resolve in a few days.  5. Persistent numbness or new problems with movement should be communicated to the surgeon or the [facility] [phone] [facility] [phone]).
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
## 2020-04-08 ENCOUNTER — Other Ambulatory Visit: Payer: Self-pay

## 2020-04-08 ENCOUNTER — Encounter (HOSPITAL_BASED_OUTPATIENT_CLINIC_OR_DEPARTMENT_OTHER): Payer: Self-pay | Admitting: Orthopedic Surgery

## 2020-04-11 ENCOUNTER — Other Ambulatory Visit (HOSPITAL_COMMUNITY): Payer: Self-pay | Attending: Orthopedic Surgery

## 2020-04-14 NOTE — Progress Notes (Signed)
Spoke to patient re missed covid test for planned procedure. Pt states that he will go today.  Reiterated that we cannot guarantee covid test will be resulted in time for scheduled procedure tomorrow.  Also reiterated need to quarantine after covid test complete.  Patient verbalized understanding.
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
## 2020-04-14 NOTE — Progress Notes (Signed)
Called Morrie Sheldon at [facility] to request orders for Mr. Lorman's surgery on 04/15/20.

## 2020-04-18 ENCOUNTER — Other Ambulatory Visit (HOSPITAL_COMMUNITY): Payer: Self-pay

## 2020-04-20 ENCOUNTER — Other Ambulatory Visit: Payer: Self-pay

## 2020-04-20 ENCOUNTER — Other Ambulatory Visit (HOSPITAL_COMMUNITY)
Admission: RE | Admit: 2020-04-20 | Discharge: 2020-04-20 | Disposition: A | Discharge status: Home / Self Care | Payer: HRSA Program | Source: Ambulatory Visit | Attending: Orthopedic Surgery | Admitting: Orthopedic Surgery

## 2020-04-20 ENCOUNTER — Encounter (HOSPITAL_BASED_OUTPATIENT_CLINIC_OR_DEPARTMENT_OTHER): Payer: Self-pay | Admitting: Orthopedic Surgery

## 2020-04-20 DIAGNOSIS — Z20822 Contact with and (suspected) exposure to covid-19: Secondary | ICD-10-CM | POA: Diagnosis not present

## 2020-04-20 DIAGNOSIS — Z01812 Encounter for preprocedural laboratory examination: Secondary | ICD-10-CM | POA: Insufficient documentation

## 2020-04-20 LAB — SARS CORONAVIRUS 2 (TAT 6-24 HRS): SARS Coronavirus 2: NEGATIVE

## 2020-04-22 ENCOUNTER — Ambulatory Visit (HOSPITAL_BASED_OUTPATIENT_CLINIC_OR_DEPARTMENT_OTHER): Payer: Self-pay | Admitting: Anesthesiology

## 2020-04-22 ENCOUNTER — Encounter (HOSPITAL_BASED_OUTPATIENT_CLINIC_OR_DEPARTMENT_OTHER): Payer: Self-pay | Admitting: Orthopedic Surgery

## 2020-04-22 ENCOUNTER — Other Ambulatory Visit: Payer: Self-pay

## 2020-04-22 ENCOUNTER — Ambulatory Visit (HOSPITAL_BASED_OUTPATIENT_CLINIC_OR_DEPARTMENT_OTHER)
Admission: RE | Admit: 2020-04-22 | Discharge: 2020-04-22 | Disposition: A | Discharge status: Home / Self Care | Payer: Self-pay | Attending: Orthopedic Surgery | Admitting: Orthopedic Surgery

## 2020-04-22 ENCOUNTER — Encounter (HOSPITAL_BASED_OUTPATIENT_CLINIC_OR_DEPARTMENT_OTHER)
Admission: RE | Disposition: A | Discharge status: Home / Self Care | Payer: Self-pay | Source: Home / Self Care | Attending: Orthopedic Surgery

## 2020-04-22 DIAGNOSIS — J45909 Unspecified asthma, uncomplicated: Secondary | ICD-10-CM | POA: Insufficient documentation

## 2020-04-22 DIAGNOSIS — X58XXXD Exposure to other specified factors, subsequent encounter: Secondary | ICD-10-CM | POA: Insufficient documentation

## 2020-04-22 DIAGNOSIS — S62122D Displaced fracture of lunate [semilunar], left wrist, subsequent encounter for fracture with routine healing: Secondary | ICD-10-CM

## 2020-04-22 DIAGNOSIS — S52512D Displaced fracture of left radial styloid process, subsequent encounter for closed fracture with routine healing: Secondary | ICD-10-CM | POA: Insufficient documentation

## 2020-04-22 DIAGNOSIS — S62112D Displaced fracture of triquetrum [cuneiform] bone, left wrist, subsequent encounter for fracture with routine healing: Secondary | ICD-10-CM | POA: Insufficient documentation

## 2020-04-22 DIAGNOSIS — S52612D Displaced fracture of left ulna styloid process, subsequent encounter for closed fracture with routine healing: Secondary | ICD-10-CM | POA: Insufficient documentation

## 2020-04-22 HISTORY — PX: HARDWARE REMOVAL: SHX979

## 2020-04-22 SURGERY — REMOVAL, HARDWARE
Anesthesia: Monitor Anesthesia Care | Site: Wrist | Laterality: Left

## 2020-04-22 MED ORDER — LIDOCAINE HCL 1 % IJ SOLN
INTRAMUSCULAR | Status: DC | PRN
  Administered 2020-04-22: 5 mL

## 2020-04-22 MED ORDER — ACETAMINOPHEN 500 MG PO TABS
ORAL_TABLET | ORAL | Status: AC
  Filled 2020-04-22: qty 2

## 2020-04-22 MED ORDER — PROPOFOL 10 MG/ML IV BOLUS
INTRAVENOUS | Status: DC | PRN
  Administered 2020-04-22: 60 mg via INTRAVENOUS
  Administered 2020-04-22: 100 mg via INTRAVENOUS

## 2020-04-22 MED ORDER — LACTATED RINGERS IV SOLN: INTRAVENOUS | Status: DC

## 2020-04-22 MED ORDER — BUPIVACAINE HCL (PF) 0.5 % IJ SOLN
INTRAMUSCULAR | Status: DC | PRN
  Administered 2020-04-22: 5 mL

## 2020-04-22 MED ORDER — PROPOFOL 500 MG/50ML IV EMUL
INTRAVENOUS | Status: DC | PRN
  Administered 2020-04-22: 100 ug/kg/min via INTRAVENOUS

## 2020-04-22 MED ORDER — LIDOCAINE HCL (PF) 1 % IJ SOLN
INTRAMUSCULAR | Status: AC
  Filled 2020-04-22: qty 30

## 2020-04-22 MED ORDER — CELECOXIB 200 MG PO CAPS
200.0000 mg | ORAL_CAPSULE | Freq: Once | ORAL | Status: AC
  Administered 2020-04-22: 200 mg via ORAL

## 2020-04-22 MED ORDER — CEFAZOLIN SODIUM-DEXTROSE 2-4 GM/100ML-% IV SOLN
2.0000 g | INTRAVENOUS | Status: AC
  Administered 2020-04-22: 2 g via INTRAVENOUS

## 2020-04-22 MED ORDER — MIDAZOLAM HCL 2 MG/2ML IJ SOLN: 1.0000 mg | INTRAMUSCULAR | Status: DC | PRN

## 2020-04-22 MED ORDER — CELECOXIB 200 MG PO CAPS
ORAL_CAPSULE | ORAL | Status: AC
  Filled 2020-04-22: qty 1

## 2020-04-22 MED ORDER — CEFAZOLIN SODIUM-DEXTROSE 2-4 GM/100ML-% IV SOLN
INTRAVENOUS | Status: AC
  Filled 2020-04-22: qty 100

## 2020-04-22 MED ORDER — FENTANYL CITRATE (PF) 100 MCG/2ML IJ SOLN: 50.0000 ug | INTRAMUSCULAR | Status: DC | PRN

## 2020-04-22 MED ORDER — BUPIVACAINE HCL (PF) 0.5 % IJ SOLN
INTRAMUSCULAR | Status: AC
  Filled 2020-04-22: qty 30

## 2020-04-22 MED ORDER — ACETAMINOPHEN 500 MG PO TABS
1000.0000 mg | ORAL_TABLET | Freq: Once | ORAL | Status: AC
  Administered 2020-04-22: 1000 mg via ORAL

## 2020-04-22 SURGICAL SUPPLY — 52 items
APL SKNCLS STERI-STRIP NONHPOA (GAUZE/BANDAGES/DRESSINGS)
BENZOIN TINCTURE PRP APPL 2/3 (GAUZE/BANDAGES/DRESSINGS) IMPLANT
BLADE SURG 15 STRL LF DISP TIS (BLADE) ×1 IMPLANT
BLADE SURG 15 STRL SS (BLADE) ×3
BNDG CMPR 9X4 STRL LF SNTH (GAUZE/BANDAGES/DRESSINGS) ×1
BNDG ELASTIC 3X5.8 VLCR STR LF (GAUZE/BANDAGES/DRESSINGS) ×3 IMPLANT
BNDG ELASTIC 4X5.8 VLCR STR LF (GAUZE/BANDAGES/DRESSINGS) IMPLANT
BNDG ESMARK 4X9 LF (GAUZE/BANDAGES/DRESSINGS) ×3 IMPLANT
BNDG GAUZE ELAST 4 BULKY (GAUZE/BANDAGES/DRESSINGS) ×3 IMPLANT
CLOSURE WOUND 1/2 X4 (GAUZE/BANDAGES/DRESSINGS)
CORD BIPOLAR FORCEPS 12FT (ELECTRODE) ×3 IMPLANT
COVER BACK TABLE 60X90IN (DRAPES) ×3 IMPLANT
COVER MAYO STAND STRL (DRAPES) ×3 IMPLANT
COVER WAND RF STERILE (DRAPES) IMPLANT
CUFF TOURN SGL QUICK 18X4 (TOURNIQUET CUFF) IMPLANT
DECANTER SPIKE VIAL GLASS SM (MISCELLANEOUS) IMPLANT
DRAPE EXTREMITY T 121X128X90 (DISPOSABLE) ×3 IMPLANT
DRAPE OEC MINIVIEW 54X84 (DRAPES) IMPLANT
DRAPE SURG 17X23 STRL (DRAPES) ×3 IMPLANT
DRSG EMULSION OIL 3X3 NADH (GAUZE/BANDAGES/DRESSINGS) ×3 IMPLANT
GAUZE SPONGE 4X4 12PLY STRL (GAUZE/BANDAGES/DRESSINGS) ×3 IMPLANT
GAUZE XEROFORM 1X8 LF (GAUZE/BANDAGES/DRESSINGS) ×2 IMPLANT
GLOVE BIOGEL PI IND STRL 8.5 (GLOVE) ×1 IMPLANT
GLOVE BIOGEL PI INDICATOR 8.5 (GLOVE) ×2
GLOVE SURG ORTHO 8.0 STRL STRW (GLOVE) ×3 IMPLANT
GOWN STRL REUS W/ TWL LRG LVL3 (GOWN DISPOSABLE) ×1 IMPLANT
GOWN STRL REUS W/ TWL XL LVL3 (GOWN DISPOSABLE) ×1 IMPLANT
GOWN STRL REUS W/TWL LRG LVL3 (GOWN DISPOSABLE) ×3
GOWN STRL REUS W/TWL XL LVL3 (GOWN DISPOSABLE) ×3
NDL HYPO 25X1 1.5 SAFETY (NEEDLE) ×1 IMPLANT
NEEDLE HYPO 25X1 1.5 SAFETY (NEEDLE) ×3 IMPLANT
NS IRRIG 1000ML POUR BTL (IV SOLUTION) ×3 IMPLANT
PAD CAST 3X4 CTTN HI CHSV (CAST SUPPLIES) ×1 IMPLANT
PADDING CAST ABS 3INX4YD NS (CAST SUPPLIES)
PADDING CAST ABS 4INX4YD NS (CAST SUPPLIES) ×2
PADDING CAST ABS COTTON 3X4 (CAST SUPPLIES) IMPLANT
PADDING CAST ABS COTTON 4X4 ST (CAST SUPPLIES) ×1 IMPLANT
PADDING CAST COTTON 3X4 STRL (CAST SUPPLIES) ×3
SET BASIN DAY SURGERY F.S. (CUSTOM PROCEDURE TRAY) ×3 IMPLANT
SPLINT FIBERGLASS 4X30 (CAST SUPPLIES) ×2 IMPLANT
SPLINT PLASTER CAST XFAST 3X15 (CAST SUPPLIES) IMPLANT
SPLINT PLASTER XTRA FASTSET 3X (CAST SUPPLIES)
STOCKINETTE 4X48 STRL (DRAPES) ×3 IMPLANT
STRIP CLOSURE SKIN 1/2X4 (GAUZE/BANDAGES/DRESSINGS) IMPLANT
SUT MNCRL AB 3-0 PS2 18 (SUTURE) IMPLANT
SUT MNCRL AB 4-0 PS2 18 (SUTURE) IMPLANT
SUT PROLENE 4 0 PS 2 18 (SUTURE) ×2 IMPLANT
SYR BULB EAR ULCER 3OZ GRN STR (SYRINGE) ×3 IMPLANT
SYR CONTROL 10ML LL (SYRINGE) ×3 IMPLANT
TOWEL GREEN STERILE FF (TOWEL DISPOSABLE) ×3 IMPLANT
TRAY DSU PREP LF (CUSTOM PROCEDURE TRAY) ×3 IMPLANT
UNDERPAD 30X36 HEAVY ABSORB (UNDERPADS AND DIAPERS) ×3 IMPLANT

## 2020-04-22 NOTE — Discharge Instructions (Signed)
KEEP BANDAGE CLEAN AND DRY CALL OFFICE FOR F/U APPT [id number] rx sent to [facility] oxycodone 10 KEEP HAND ELEVATED ABOVE HEART OK TO APPLY ICE TO OPERATIVE AREA CONTACT OFFICE IF ANY WORSENING PAIN OR CONCERNS.   Post Anesthesia Home Care Instructions  Activity: Get plenty of rest for the remainder of the day. A responsible individual must stay with you for 24 hours following the procedure.  For the next 24 hours, DO NOT: -Drive a car -Advertising copywriter -Drink alcoholic beverages -Take any medication unless instructed by your physician -Make any legal decisions or sign important papers.  Meals: Start with liquid foods such as gelatin or soup. Progress to regular foods as tolerated. Avoid greasy, spicy, heavy foods. If nausea and/or vomiting occur, drink only clear liquids until the nausea and/or vomiting subsides. Call your physician if vomiting continues.  Special Instructions/Symptoms: Your throat may feel dry or sore from the anesthesia or the breathing tube placed in your throat during surgery. If this causes discomfort, gargle with warm salt water. The discomfort should disappear within 24 hours.     Call your surgeon if you experience:   1.  Fever over 101.0. 2.  Inability to urinate. 3.  Nausea and/or vomiting. 4.  Extreme swelling or bruising at the surgical site. 5.  Continued bleeding from the incision. 6.  Increased pain, redness or drainage from the incision. 7.  Problems related to your pain medication. 8.  Any problems and/or concerns  *May take Tylenol at 7pm *May take Ibuprofen at 9pm

## 2020-04-22 NOTE — Anesthesia Postprocedure Evaluation (Signed)
Anesthesia Post Note  Patient: Kent Roman  Procedure(s) Performed: HARDWARE REMOVAL deep implant removal (Left Wrist)     Patient location during evaluation: PACU Anesthesia Type: MAC Level of consciousness: awake and alert Pain management: pain level controlled Vital Signs Assessment: post-procedure vital signs reviewed and stable Respiratory status: spontaneous breathing and respiratory function stable Cardiovascular status: stable Postop Assessment: no apparent nausea or vomiting Anesthetic complications: no Comments: Pt and mother counseled about elevated blood pressure and the need for evaluation by a primary care physician. Both voiced understanding and committed to seek evaluation and treatment soon.    Last Vitals:  Vitals:   04/22/20 1445 04/22/20 1455  BP: (!) 149/102 (!) 144/107  Pulse: 68 (!) 59  Resp: (!) 24 17  Temp:    SpO2: 100% 100%    Last Pain:  Vitals:   04/22/20 1455  TempSrc:   PainSc: 0-No pain                 Genaro Bekker DANIEL

## 2020-04-22 NOTE — Op Note (Signed)
PREOPERATIVE DIAGNOSIS: Left wrist retained deep implants  POSTOPERATIVE DIAGNOSIS: Same  ATTENDING SURGEON: Dr. Bradly Bienenstock who scrubbed and present the entire procedure  ASSISTANT SURGEON: None  ANESTHESIA: IV sedation  OPERATIVE PROCEDURE: Left wrist removal of deep implants buried K wires x6 Radiographs 3 views left wrist  IMPLANTS: None  RADIOGRAPHIC INTERPRETATION: AP lateral oblique views of the wrist do show the removal of the K wires with good alignment of the radiocarpal midcarpal joints  SURGICAL INDICATIONS: Patient is a right-hand-dominant gentleman who sustained the fracture dislocation of the wrist.  Patient seen and evaluated and recommended undergo the above procedure.  The risks of surgery include but not limited to bleeding infection damage nearby nerves arteries or tendons loss of motion of the wrist and digits incomplete relief of symptoms and need for further surgical intervention  SURGICAL TECHNIQUE: Patient palpated via the preoperative holding area marked for marker made the left wrist indicate correct operative site.  Patient brought back operating placed supine on anesthesia table where the IV sedation was administered.  The hand was prepped sterilely.  Patient tolerates well.  With the aid of the mini C arm the K wires were then buried small skin incisions were then made K wires were then removed without any complicating feature.  The wound was then thoroughly irrigated.  Small skin sutures were then used to close the wound sites.  Xeroform dressing sterile compressive bandage then applied.  The patient tolerated procedure well was placed in a short arm volar splint taken recovery room in good condition.  POSTOPERATIVE PLAN: Patient be discharged to home.  See him back in the office in 2 weeks for wound check suture removal x-rays transition to a short arm brace write the prescription for the brace and begin outpatient therapy regimen to the current facility.

## 2020-04-22 NOTE — Anesthesia Preprocedure Evaluation (Addendum)
Anesthesia Evaluation  Patient identified by MRN, date of birth, ID band Patient awake    Reviewed: Allergy & Precautions, NPO status , Patient's Chart, lab work & pertinent test results  History of Anesthesia Complications Negative for: history of anesthetic complications  Airway Mallampati: II  TM Distance: >3 FB Neck ROM: Full    Dental no notable dental hx. (+) Dental Advisory Given   Pulmonary asthma ,    Pulmonary exam normal        Cardiovascular negative cardio ROS Normal cardiovascular exam     Neuro/Psych negative neurological ROS     GI/Hepatic negative GI ROS, Neg liver ROS,   Endo/Other  negative endocrine ROS  Renal/GU negative Renal ROS     Musculoskeletal negative musculoskeletal ROS (+)   Abdominal   Peds  Hematology negative hematology ROS (+)   Anesthesia Other Findings Day of surgery medications reviewed with the patient.  Reproductive/Obstetrics                            Anesthesia Physical  Anesthesia Plan  ASA: II  Anesthesia Plan: MAC   Post-op Pain Management:    Induction: Intravenous  PONV Risk Score and Plan: 2 and Ondansetron, Treatment may vary due to age or medical condition and Propofol infusion  Airway Management Planned: Natural Airway and Simple Face Mask  Additional Equipment:   Intra-op Plan:   Post-operative Plan:   Informed Consent: I have reviewed the patients History and Physical, chart, labs and discussed the procedure including the risks, benefits and alternatives for the proposed anesthesia with the patient or authorized representative who has indicated his/her understanding and acceptance.     Dental advisory given  Plan Discussed with: Anesthesiologist, CRNA and Surgeon  Anesthesia Plan Comments:       Anesthesia Quick Evaluation

## 2020-04-22 NOTE — Transfer of Care (Signed)
Immediate Anesthesia Transfer of Care Note  Patient: Kent Roman  Procedure(s) Performed: HARDWARE REMOVAL deep implant removal (Left Wrist)  Patient Location: PACU  Anesthesia Type:MAC  Level of Consciousness: awake  Airway & Oxygen Therapy: Patient Spontanous Breathing  Post-op Assessment: Report given to RN and Post -op Vital signs reviewed and stable  Post vital signs: Reviewed and stable  Last Vitals:  Vitals Value Taken Time  BP    Temp    Pulse 67 04/22/20 1436  Resp 7 04/22/20 1436  SpO2 100 % 04/22/20 1436  Vitals shown include unvalidated device data.  Last Pain:  Vitals:   04/22/20 1250  TempSrc: Oral  PainSc:          Complications: No apparent anesthesia complications

## 2020-04-24 ENCOUNTER — Encounter: Payer: Self-pay | Admitting: *Deleted
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
## 2022-12-07 ENCOUNTER — Encounter: Payer: Self-pay | Admitting: Family Medicine

## 2022-12-07 ENCOUNTER — Ambulatory Visit (INDEPENDENT_AMBULATORY_CARE_PROVIDER_SITE_OTHER): Payer: 59 | Admitting: Family Medicine

## 2022-12-07 ENCOUNTER — Ambulatory Visit: Payer: Self-pay | Admitting: Family Medicine

## 2022-12-07 VITALS — BP 134/89 | HR 71 | Temp 97.5°F | Ht 74.0 in | Wt 271.4 lb

## 2022-12-07 DIAGNOSIS — Z823 Family history of stroke: Secondary | ICD-10-CM

## 2022-12-07 DIAGNOSIS — E669 Obesity, unspecified: Secondary | ICD-10-CM

## 2022-12-07 DIAGNOSIS — I10 Essential (primary) hypertension: Secondary | ICD-10-CM

## 2022-12-07 DIAGNOSIS — Z8249 Family history of ischemic heart disease and other diseases of the circulatory system: Secondary | ICD-10-CM

## 2022-12-07 DIAGNOSIS — R600 Localized edema: Secondary | ICD-10-CM | POA: Diagnosis not present

## 2022-12-07 DIAGNOSIS — Z803 Family history of malignant neoplasm of breast: Secondary | ICD-10-CM

## 2022-12-07 MED ORDER — HYDROCHLOROTHIAZIDE 25 MG PO TABS: 25.0000 mg | ORAL_TABLET | Freq: Every day | ORAL | 3 refills | Status: DC

## 2022-12-07 NOTE — Progress Notes (Signed)
Subjective: GX:QJJHERDEY care, HTN HPI: Kent Roman is a 26 y.o. male presenting to clinic today for:  1. HTN Patient was seen at the emergency department back in November and blood pressure was extremely elevated at 173/109.  He reports that this is pretty consistent with what his blood pressure has been running.  He does report occasional headache and lower extremity edema but denies any visual disturbance, chest pain, shortness of breath.  There is a significant family history of myocardial infarction which ultimately led to his father's death in his late 39s.  Mother also suffered a CVA before age 81 and unfortunately passed away recently from complications of breast cancer at age 23.  Patient has been changing his diet since he started having issues with blood pressure and is really trying to focus on plant-based diet with water only.  Not actively exercising but does plan to implement an exercise program soon.  Past Medical History:  Diagnosis Date   Asthma    Past Surgical History:  Procedure Laterality Date   HARDWARE REMOVAL Left 04/22/2020   Procedure: HARDWARE REMOVAL deep implant removal;  Surgeon: Bradly Bienenstock, MD;  Location: [facility];  Service: Orthopedics;  Laterality: Left;   LIGAMENT REPAIR  02/12/2020   Procedure: LIGAMENT REPAIR;  Surgeon: Bradly Bienenstock, MD;  Location: [facility];  Service: Orthopedics;;   MASS EXCISION N/A 04/02/2018   Procedure: EXCISION 1.5 CYST SCALP;  Surgeon: Franky Macho, MD;  Location: [facility];  Service: General;  Laterality: N/A;   ORIF WRIST FRACTURE Left 02/08/2020   Procedure: CLOSED REDUCTION WRIST FRACTURE;  Surgeon: Bradly Bienenstock, MD;  Location: [facility];  Service: Orthopedics;  Laterality: Left;   ORIF WRIST FRACTURE Left 02/12/2020   Procedure: WRIST LIGAMENTOUS RECONSTRUCTION AND TENDON/LIGAMENT GRAFTING RECONSTRUCTION AS INDICATED;  Surgeon: Bradly Bienenstock, MD;  Location: [facility];  Service:  Orthopedics;  Laterality: Left;  WITH REGIONAL BLOCK NEEDS 2 HOURS   WISDOM TOOTH EXTRACTION     Social History   Socioeconomic History   Marital status: Single    Spouse name: Not on file   Number of children: 0   Years of education: Not on file   Highest education level: Not on file  Occupational History   Not on file  Tobacco Use   Smoking status: Never   Smokeless tobacco: Never  Vaping Use   Vaping Use: Never used  Substance and Sexual Activity   Alcohol use: No   Drug use: Yes    Types: Marijuana    Comment: 2-3 times daily use   Sexual activity: Yes    Birth control/protection: Condom  Other Topics Concern   Not on file  Social History Narrative   Currently pursuing college degree in early childhood development. 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 Not in a relationship currently. No children.   Social Determinants of Health   Financial Resource Strain: Not on file  Food Insecurity: Not on file  Transportation Needs: Not on file  Physical Activity: Not on file  Stress: Not on file  Social Connections: Not on file  Intimate Partner Violence: Not on file   Current Meds  Medication Sig   hydrochlorothiazide (HYDRODIURIL) 25 MG tablet Take 1 tablet (25 mg total) by mouth daily. For blood pressure   Family History  Problem Relation Age of Onset   Hypertension Mother    Cancer Mother 56  breast   Diabetes Father    Hypertension Father    Heart attack Father 52   Diabetes Maternal Aunt    Hypertension Other    Cancer Other    No Known Allergies      ROS: Per HPI  Objective: Office vital signs reviewed. BP 134/89   Pulse 71   Temp (!) 97.5 F (36.4 C)   Ht 6\' 2"  (1.88 m)   Wt 271 lb 6.4 oz (123.1 kg)   SpO2 97%   BMI 34.85 kg/m   Physical Examination:  General: Awake, alert, obese, well appearing male, No acute distress HEENT: Normal    Neck: No masses palpated. No lymphadenopathy     Ears: Tympanic membranes intact, normal light reflex, no erythema, no bulging    Eyes: PERRLA, extraocular movement in tact, sclera white    Nose: nasal turbinates moist, no nasal discharge    Throat: moist mucus membranes, no erythema, no tonsillar exudate.  Airway is patent Cardio: regular rate and rhythm, S1S2 heard, no murmurs appreciated Pulm: clear to auscultation bilaterally, no wheezes, rhonchi or rales; normal work of breathing on room air GI: nontender Extremities: warm, well perfused, trace to +1 ankle edema, No cyanosis or clubbing; +2 pulses bilaterally MSK: normal gait and station Skin: dry; intact; no rashes or lesions; tattoos present Neuro: no focal deficits  Flowsheet Row Office Visit from 12/07/2022 in 12/09/2022 Family [facility]  PHQ-2 Total Score 2         12/07/2022    3:21 PM  GAD 7 : Generalized Anxiety Score  Nervous, Anxious, on Edge 0  Control/stop worrying 0  Worry too much - different things 0  Trouble relaxing 0  Restless 0  Easily annoyed or irritable 2  Afraid - awful might happen 0  Total GAD 7 Score 2  Anxiety Difficulty Not difficult at all    Assessment/ Plan: 26 y.o. male   Essential hypertension - Plan: hydrochlorothiazide (HYDRODIURIL) 25 MG tablet  Bilateral leg edema  Family history of early CAD  Family history of cerebrovascular accident (CVA) in mother  Family history of breast cancer in first degree relative  Obesity (BMI 30.0-34.9)  Start HCTZ 12.5-25mg  qd PRN edema or BP >140/90.  His repeat BP still consistent with HTN but technically considered controlled hypertension.  Stressed the importance of BP control and balanced lifestyle given high inherited risk.  Will reassess in 1 month. Continue lifestyle modification.  Plan for full physical with fasting labs in 1 month.  Specifically would like to readdress smoking at next visit.  May need to consider SSRI, etc.  04-22-1995, DO [facility] [phone]

## 2022-12-07 NOTE — Patient Instructions (Addendum)
We will perform FASTING labs (so don't eat or drink anything but water for 8 hours prior to arrival).  Ok to take your medications. Hydrate WELL with water before appointment. HCTZ 25mg  sent to pharmacy.  Take 1/2-1 tablet daily when you wake up IF needed for BP >140/90 OR swelling in legs.   Please remember to arrive to your appointment 15 minutes prior to your scheduled time.  If you are late to future visits I may be unable to accommodate your appointment and you may be asked to reschedule.   How to Take Your Blood Pressure Blood pressure measures how strongly your blood is pressing against the walls of your arteries. Arteries are blood vessels that carry blood from your heart throughout your body. You can take your blood pressure at home with a machine. You may need to check your blood pressure at home: To check if you have high blood pressure (hypertension). To check your blood pressure over time. To make sure your blood pressure medicine is working. Supplies needed: Blood pressure machine, or monitor. A chair to sit in. This should be a chair where you can sit upright with your back supported. Do not sit on a soft couch or an armchair. Table or desk. Small notebook. Pencil or pen. How to prepare Avoid these things for 30 minutes before checking your blood pressure: Having drinks with caffeine in them, such as coffee or tea. Drinking alcohol. Eating. Smoking. Exercising. Do these things five minutes before checking your blood pressure: Go to the bathroom and pee (urinate). Sit in a chair. Be quiet. Do not talk. How to take your blood pressure Follow the instructions that came with your machine. If you have a digital blood pressure monitor, these may be the instructions: Sit up straight. Place your feet on the floor. Do not cross your ankles or legs. Rest your left arm at the level of your heart. You may rest it on a table, desk, or chair. Pull up your shirt sleeve. Wrap the  blood pressure cuff around the upper part of your left arm. The cuff should be 1 inch (2.5 cm) above your elbow. It is best to wrap the cuff around bare skin. Fit the cuff snugly around your arm, but not too tightly. You should be able to place only one finger between the cuff and your arm. Place the cord so that it rests in the bend of your elbow. Press the power button. Sit quietly while the cuff fills with air and loses air. Write down the numbers on the screen. Wait 2-3 minutes and then repeat steps 1-10. What do the numbers mean? Two numbers make up your blood pressure. The first number is called systolic pressure. The second is called diastolic pressure. An example of a blood pressure reading is "120 over 80" (or 120/80). If you are an adult and do not have a medical condition, use this guide to find out if your blood pressure is normal: Normal First number: below 120. Second number: below 80. Elevated First number: 120-129. Second number: below 80. Hypertension stage 1 First number: 130-139. Second number: 80-89. Hypertension stage 2 First number: 140 or above. Second number: 90 or above. Your blood pressure is above normal even if only the first or only the second number is above normal. Follow these instructions at home: Medicines Take over-the-counter and prescription medicines only as told by your doctor. Tell your doctor if your medicine is causing side effects. General instructions Check your blood pressure  as often as your doctor tells you to. Check your blood pressure at the same time every day. Take your monitor to your next doctor's appointment. Your doctor will: Make sure you are using it correctly. Make sure it is working right. Understand what your blood pressure numbers should be. Keep all follow-up visits. General tips You will need a blood pressure machine or monitor. Your doctor can suggest a monitor. You can buy one at a drugstore or online. When choosing  one: Choose one with an arm cuff. Choose one that wraps around your upper arm. Only one finger should fit between your arm and the cuff. Do not choose one that measures your blood pressure from your wrist or finger. Where to find more information American Heart Association: www.heart.org Contact a doctor if: Your blood pressure keeps being high. Your blood pressure is suddenly low. Get help right away if: Your first blood pressure number is higher than 180. Your second blood pressure number is higher than 120. These symptoms may be an emergency. Do not wait to see if the symptoms will go away. Get help right away. Call 911. Summary Check your blood pressure at the same time every day. Avoid caffeine, alcohol, smoking, and exercise for 30 minutes before checking your blood pressure. Make sure you understand what your blood pressure numbers should be. This information is not intended to replace advice given to you by your health care provider. Make sure you discuss any questions you have with your health care provider. Document Revised: 08/19/2021 Document Reviewed: 08/19/2021 Elsevier Patient Education  2023 [employer].

## 2022-12-09 ENCOUNTER — Encounter: Payer: Self-pay | Admitting: Family Medicine

## 2022-12-09 DIAGNOSIS — Z803 Family history of malignant neoplasm of breast: Secondary | ICD-10-CM | POA: Insufficient documentation

## 2022-12-09 DIAGNOSIS — E669 Obesity, unspecified: Secondary | ICD-10-CM | POA: Insufficient documentation

## 2022-12-09 DIAGNOSIS — E66811 Obesity, class 1: Secondary | ICD-10-CM | POA: Insufficient documentation

## 2022-12-09 DIAGNOSIS — I10 Essential (primary) hypertension: Secondary | ICD-10-CM | POA: Insufficient documentation

## 2022-12-09 DIAGNOSIS — R600 Localized edema: Secondary | ICD-10-CM | POA: Insufficient documentation

## 2022-12-09 DIAGNOSIS — Z8249 Family history of ischemic heart disease and other diseases of the circulatory system: Secondary | ICD-10-CM | POA: Insufficient documentation

## 2022-12-09 DIAGNOSIS — Z823 Family history of stroke: Secondary | ICD-10-CM | POA: Insufficient documentation

## 2023-01-18 ENCOUNTER — Ambulatory Visit (INDEPENDENT_AMBULATORY_CARE_PROVIDER_SITE_OTHER): Payer: 59 | Admitting: Family Medicine

## 2023-01-18 ENCOUNTER — Encounter: Payer: Self-pay | Admitting: Family Medicine

## 2023-01-18 ENCOUNTER — Ambulatory Visit: Payer: Self-pay | Admitting: Family Medicine

## 2023-01-18 ENCOUNTER — Telehealth: Payer: Self-pay | Admitting: Family Medicine

## 2023-01-18 VITALS — BP 130/88 | HR 70 | Temp 98.7°F | Ht 74.0 in | Wt 266.0 lb

## 2023-01-18 DIAGNOSIS — R600 Localized edema: Secondary | ICD-10-CM

## 2023-01-18 DIAGNOSIS — I8391 Asymptomatic varicose veins of right lower extremity: Secondary | ICD-10-CM | POA: Insufficient documentation

## 2023-01-18 DIAGNOSIS — E669 Obesity, unspecified: Secondary | ICD-10-CM

## 2023-01-18 DIAGNOSIS — I10 Essential (primary) hypertension: Secondary | ICD-10-CM | POA: Diagnosis not present

## 2023-01-18 NOTE — Telephone Encounter (Signed)
Patient coming in earlier for his apt per [facility] nurse

## 2023-01-18 NOTE — Progress Notes (Signed)
Subjective: CC: Hypertension PCP: Janora Norlander, DO KZS:[id number] Kent Roman is a 27 y.o. male presenting to clinic today for:  1.  Hypertension associated with obesity Patient has not joined the gym yet.  He is trying to cut back on poor dietary choices however.  He has been compliant with the hydrochlorothiazide since her last visit and has noticed an improvement in his swelling in the legs.  Of course he is eager to get off of the blood pressure medicine and for this reason is really going to focus on getting back into the gym.  His goal is to get back to 230 pounds.  No chest pain, shortness of breath or dizziness reported.  Continues to smoke marijuana intermittently and this does raise his blood pressure.  For now he does not want to be on any pharmacologic's for anxiety or depression.  He does not want to see a counselor as he finds peace and solitude and is not sure that talk therapy would be beneficial to him.  He feels well supported by his family   ROS: Per HPI  No Known Allergies Past Medical History:  Diagnosis Date   Asthma     Current Outpatient Medications:    hydrochlorothiazide (HYDRODIURIL) 25 MG tablet, Take 1 tablet (25 mg total) by mouth daily. For blood pressure, Disp: 90 tablet, Rfl: 3 Social History   Socioeconomic History   Marital status: Single    Spouse name: Not on file   Number of children: 0   Years of education: Not on file   Highest education level: Not on file  Occupational History   Not on file  Tobacco Use   Smoking status: Never   Smokeless tobacco: Never  Vaping Use   Vaping Use: Never used  Substance and Sexual Activity   Alcohol use: No   Drug use: Yes    Types: Marijuana    Comment: 2-3 times daily use   Sexual activity: Yes    Birth control/protection: Condom  Other Topics Concern   Not on file  Social History Narrative   Currently pursuing college degree in early childhood development. 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 Not in a relationship currently. No children.   Social Determinants of Health   Financial Resource Strain: Not on file  Food Insecurity: Not on file  Transportation Needs: Not on file  Physical Activity: Not on file  Stress: Not on file  Social Connections: Not on file  Intimate Partner Violence: Not on file   Family History  Problem Relation Age of Onset   Hypertension Mother    Cancer Mother 13       breast   Diabetes Father    Hypertension Father    Heart attack Father 61   Diabetes Maternal Aunt    Hypertension Other    Cancer Other     Objective: Office vital signs reviewed. BP 130/88   Pulse 70   Temp 98.7 F (37.1 C)   Ht 6\' 2"  (1.88 m)   Wt 266 lb (120.7 kg)   SpO2 96%   BMI 34.15 kg/m   Physical Examination:  General: Awake, alert, well-appearing obese male. No acute distress HEENT: sclera white, MMM Cardio: regular rate and rhythm, S1S2 heard, no murmurs appreciated Pulm: clear to auscultation bilaterally, no wheezes, rhonchi or rales; normal work of breathing on room air Extremities: warm, well perfused, trace ankle edema.  No cyanosis  or clubbing; +2 pulses bilaterally varicose veins noted along the medial calf on the right    Assessment/ Plan: 27 y.o. male   Essential hypertension - Plan: CBC with Differential/Platelet, Comprehensive metabolic panel, Lipid panel  Bilateral leg edema - Plan: CBC with Differential/Platelet, Comprehensive metabolic panel, Lipid panel  Obesity (BMI 30.0-34.9)  Asymptomatic varicose veins of right lower extremity  Blood pressure is controlled.  He will continue hydrochlorothiazide for now.  I encouraged him to start exercise regimen and lifestyle modification because his goal is to get off of medicine totally.  I think that he can make this goal if he sticks to the plan.  Will plan to follow-up again in the 4 to 6 months for blood pressure recheck and check of weight.   Hopefully we can get him off medication at that time.  Edema is responding well to the diuretic.  Check fasting labs  Working on lifestyle modification to reduce weight.  Varicose vein of right leg is currently asymptomatic.  Continue to monitor  No orders of the defined types were placed in this encounter.  No orders of the defined types were placed in this encounter.    Janora Norlander, DO [place] [phone]

## 2023-07-21 ENCOUNTER — Encounter: Payer: Self-pay | Admitting: Family Medicine

## 2023-07-21 ENCOUNTER — Ambulatory Visit (INDEPENDENT_AMBULATORY_CARE_PROVIDER_SITE_OTHER): Payer: 59 | Admitting: Family Medicine

## 2023-07-21 VITALS — BP 121/76 | HR 79 | Ht 74.0 in | Wt 255.0 lb

## 2023-07-21 DIAGNOSIS — Z0001 Encounter for general adult medical examination with abnormal findings: Secondary | ICD-10-CM

## 2023-07-21 DIAGNOSIS — Z130 Encounter for screening for diseases of the blood and blood-forming organs and certain disorders involving the immune mechanism: Secondary | ICD-10-CM

## 2023-07-21 DIAGNOSIS — I10 Essential (primary) hypertension: Secondary | ICD-10-CM

## 2023-07-21 DIAGNOSIS — E669 Obesity, unspecified: Secondary | ICD-10-CM | POA: Diagnosis not present

## 2023-07-21 DIAGNOSIS — L301 Dyshidrosis [pompholyx]: Secondary | ICD-10-CM

## 2023-07-21 DIAGNOSIS — Z0000 Encounter for general adult medical examination without abnormal findings: Secondary | ICD-10-CM

## 2023-07-21 LAB — BAYER DCA HB A1C WAIVED: HB A1C (BAYER DCA - WAIVED): 4.5 % — ABNORMAL LOW (ref 4.8–5.6)

## 2023-07-21 MED ORDER — HYDROCHLOROTHIAZIDE 25 MG PO TABS: 25.0000 mg | ORAL_TABLET | Freq: Every day | ORAL | 3 refills | Status: AC

## 2023-07-21 NOTE — Patient Instructions (Addendum)
Dyshidrotic Eczema Dyshidrotic eczema, also known as pompholyx, is a type of eczema that causes very itchy, fluid-filled blisters (vesicles) to form on the hands and feet. It is more common before age 29, though it can affect people of any age. There is no cure, but treatment and certain lifestyle changes can help relieve symptoms. What are the causes? The cause of this condition is not known. What increases the risk? You are more likely to develop this condition if: You wash your hands frequently. You have a personal or family history of eczema, allergies, asthma, or hay fever. You are allergic to metals, such as nickel or cobalt. You work with cement. You smoke. What are the signs or symptoms? Symptoms of this condition may affect the hands, the feet, or both. Symptoms may come and go (recur), and may include: Severe itching. This may happen before blisters appear. Blisters. These may form suddenly. In the early stages, blisters may form near the fingertips. In severe cases, blisters may grow to large blister masses (bullae). Blisters resolve in 2-3 weeks without bursting. This is followed by a dry phase in which itching eases. Pain and swelling. Cracks or long, narrow openings (fissures) in the skin. Severe dryness. Ridges on the nails. How is this diagnosed? This condition may be diagnosed based on: Your symptoms and a physical exam. Your medical history. Skin scrapings to rule out a fungal infection. Testing a swab of fluid for bacteria (culture). Removing a small piece of skin (biopsy) to test for infection or to rule out other conditions. Skin patch tests. These tests involve using patches that contain possible allergens and placing them on your back. Your health care provider will wait a few days and then check to see if an allergic reaction occurred. These tests may be done if your health care provider suspects allergic reactions, or to rule out other types of eczema. You may  be referred to a health care provider who specializes in skin conditions (dermatologist) to help diagnose and treat this condition. How is this treated? There is no cure for this condition, but treatment can help relieve symptoms. Depending on the amount and severity of the blisters, your health care provider may suggest: Avoiding allergens, irritants, or triggers that worsen symptoms. This may involve lifestyle changes, such as: Using different lotions or soaps. Avoiding hot weather or places that will cause you to sweat a lot. Managing stress with coping techniques, such as relaxation and exercise, and asking for help when you need it. Diet changes as recommended by your health care provider. Using a clean, damp towel (cool compress) to relieve symptoms. Soaking in a bath that contains a type of salt that relieves irritation (aluminum acetate soaks). Medicines, such as: Medicine taken by mouth to reduce itching (oral antihistamines). Medicine applied to the skin to reduce swelling and irritation (topical corticosteroids). Medicine that reduces the activity of the body's disease-fighting system (immunosuppressants) to treat inflammation. This may be given in severe cases. Antibiotic medicines to treat bacterial infection. Light therapy (phototherapy). This involves shining ultraviolet (UV) light on the affected skin in order to reduce itchiness and inflammation. Follow these instructions at home: Bathing and skin care  Wash skin gently. After bathing or washing your hands, pat your skin dry. Avoid rubbing your skin. Remove all jewelry before bathing. If the skin under the jewelry stays wet, blisters may form or get worse. Apply cool compresses as told by your health care provider. To do this: Soak a clean towel in  cool water. Wring out excess water until towel is damp. Place the towel over the affected skin. Leave the towel on for 20 minutes at a time, 2-3 times a day. Use mild soaps,  cleansers, and lotions that do not contain dyes, perfumes, or other irritants. Keep your skin hydrated. To do this: Avoid very hot water. Take lukewarm baths or showers. Apply moisturizer within 3 minutes of bathing. This locks in moisture. Medicines Take and apply over-the-counter and prescription medicines only as told by your health care provider. If you were prescribed an antibiotic medicine, take or apply it as told by your health care provider. Do not stop using the antibiotic even if you start to feel better. General instructions Do not use any products that contain nicotine or tobacco. These include cigarettes, chewing tobacco, and vaping devices, such as e-cigarettes. If you need help quitting, ask your health care provider. Identify and avoid triggers and allergens. Keep fingernails short to avoid breaking the skin while scratching. Use waterproof gloves to protect your hands when doing work that keeps your hands wet for a long time. Wear socks to keep your feet dry. Keep all follow-up visits. This is important. Contact a health care provider if: You have symptoms that do not go away. You have signs of infection, such as: Crusting, pus, or a bad smell. More redness, swelling, or pain. Increased warmth in the affected area. Get help right away if: Your skin gets streaking redness with associated pain. Summary Dyshidrotic eczema, also known as pompholyx, is a type of eczema that causes very itchy, fluid-filled blisters (vesicles) to form on the hands and feet. The cause of this condition is not known. There is no cure for this condition, but treatment can help relieve symptoms. Treatment depends on the amount and severity of the blisters. Use mild soaps, cleansers, and lotions that do not contain dyes, perfumes, or other irritants. Keep your skin hydrated. This information is not intended to replace advice given to you by your health care provider. Make sure you discuss any  questions you have with your health care provider. Document Revised: 09/14/2020 Document Reviewed: 09/14/2020 Elsevier Patient Education  2024 [employer].   Preventive Care 45-43 Years Old, Male Preventive care refers to lifestyle choices and visits with your health care provider that can promote health and wellness. Preventive care visits are also called wellness exams. What can I expect for my preventive care visit? Counseling During your preventive care visit, your health care provider may ask about your: Medical history, including: Past medical problems. Family medical history. Current health, including: Emotional well-being. Home life and relationship well-being. Sexual activity. Lifestyle, including: Alcohol, nicotine or tobacco, and drug use. Access to firearms. Diet, exercise, and sleep habits. Safety issues such as seatbelt and bike helmet use. Sunscreen use. Work and work Astronomer. Physical exam Your health care provider may check your: Height and weight. These may be used to calculate your BMI (body mass index). BMI is a measurement that tells if you are at a healthy weight. Waist circumference. This measures the distance around your waistline. This measurement also tells if you are at a healthy weight and may help predict your risk of certain diseases, such as type 2 diabetes and high blood pressure. Heart rate and blood pressure. Body temperature. Skin for abnormal spots. What immunizations do I need?  Vaccines are usually given at various ages, according to a schedule. Your health care provider will recommend vaccines for you based on your age, medical  history, and lifestyle or other factors, such as travel or where you work. What tests do I need? Screening Your health care provider may recommend screening tests for certain conditions. This may include: Lipid and cholesterol levels. Diabetes screening. This is done by checking your blood sugar (glucose) after  you have not eaten for a while (fasting). Hepatitis B test. Hepatitis C test. HIV (human immunodeficiency virus) test. STI (sexually transmitted infection) testing, if you are at risk. Talk with your health care provider about your test results, treatment options, and if necessary, the need for more tests. Follow these instructions at home: Eating and drinking  Eat a healthy diet that includes fresh fruits and vegetables, whole grains, lean protein, and low-fat dairy products. Drink enough fluid to keep your urine pale yellow. Take vitamin and mineral supplements as recommended by your health care provider. Do not drink alcohol if your health care provider tells you not to drink. If you drink alcohol: Limit how much you have to 0-2 drinks a day. Know how much alcohol is in your drink. In the [place] one drink equals one 12 oz bottle of beer (355 mL), one 5 oz glass of wine (148 mL), or one 1 oz glass of hard liquor (44 mL). Lifestyle Brush your teeth every morning and night with fluoride toothpaste. Floss one time each day. Exercise for at least 30 minutes 5 or more days each week. Do not use any products that contain nicotine or tobacco. These products include cigarettes, chewing tobacco, and vaping devices, such as e-cigarettes. If you need help quitting, ask your health care provider. Do not use drugs. If you are sexually active, practice safe sex. Use a condom or other form of protection to prevent STIs. Find healthy ways to manage stress, such as: Meditation, yoga, or listening to music. Journaling. Talking to a trusted person. Spending time with friends and family. Minimize exposure to UV radiation to reduce your risk of skin cancer. Safety Always wear your seat belt while driving or riding in a vehicle. Do not drive: If you have been drinking alcohol. Do not ride with someone who has been drinking. If you have been using any mind-altering substances or drugs. While  texting. When you are tired or distracted. Wear a helmet and other protective equipment during sports activities. If you have firearms in your house, make sure you follow all gun safety procedures. Seek help if you have been physically or sexually abused. What's next? Go to your health care provider once a year for an annual wellness visit. Ask your health care provider how often you should have your eyes and teeth checked. Stay up to date on all vaccines. This information is not intended to replace advice given to you by your health care provider. Make sure you discuss any questions you have with your health care provider. Document Revised: 06/02/2021 Document Reviewed: 06/02/2021 Elsevier Patient Education  2024 [employer].

## 2023-07-21 NOTE — Progress Notes (Signed)
Kent Roman is a 27 y.o. male presents to office today for annual physical exam examination.    Concerns today include: 1. None. Doing well  Occupation: works for [employer], [employer] and [employer], Marital status: has GF, Substance use: occasional [employer] Health Maintenance Due  Topic Date Due   COVID-19 Vaccine (1 - 2023-24 season) Never done   INFLUENZA VACCINE  07/20/2023   Refills needed today: none  Immunization History  Administered Date(s) Administered   Td 07/09/2015   Past Medical History:  Diagnosis Date   Asthma    Social History   Socioeconomic History   Marital status: Single    Spouse name: Not on file   Number of children: 0   Years of education: Not on file   Highest education level: Not on file  Occupational History   Not on file  Tobacco Use   Smoking status: Never   Smokeless tobacco: Never  Vaping Use   Vaping status: Never Used  Substance and Sexual Activity   Alcohol use: No   Drug use: Yes    Types: Marijuana    Comment: 2-3 times daily use   Sexual activity: Yes    Birth control/protection: Condom  Other Topics Concern   Not on file  Social History Narrative   Currently pursuing college degree in early childhood development. 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 Not in a relationship currently. No children.   Social Determinants of Health   Financial Resource Strain: Not on file  Food Insecurity: Not on file  Transportation Needs: Not on file  Physical Activity: Not on file  Stress: Not on file  Social Connections: Not on file  Intimate Partner Violence: Not on file   Past Surgical History:  Procedure Laterality Date   HARDWARE REMOVAL Left 04/22/2020   Procedure: HARDWARE REMOVAL deep implant removal;  Surgeon: Bradly Bienenstock, MD;  Location: [facility];  Service: Orthopedics;  Laterality: Left;   LIGAMENT REPAIR  02/12/2020   Procedure: LIGAMENT REPAIR;   Surgeon: Bradly Bienenstock, MD;  Location: [facility];  Service: Orthopedics;;   MASS EXCISION N/A 04/02/2018   Procedure: EXCISION 1.5 CYST SCALP;  Surgeon: Franky Macho, MD;  Location: [facility];  Service: General;  Laterality: N/A;   ORIF WRIST FRACTURE Left 02/08/2020   Procedure: CLOSED REDUCTION WRIST FRACTURE;  Surgeon: Bradly Bienenstock, MD;  Location[facility];  Service: Orthopedics;  Laterality: Left;   ORIF WRIST FRACTURE Left 02/12/2020   Procedure: WRIST LIGAMENTOUS RECONSTRUCTION AND TENDON/LIGAMENT GRAFTING RECONSTRUCTION AS INDICATED;  Surgeon: Bradly Bienenstock, MD;  Location: [facility];  Service: Orthopedics;  Laterality: Left;  WITH REGIONAL BLOCK NEEDS 2 HOURS   WISDOM TOOTH EXTRACTION     Family History  Problem Relation Age of Onset   Hypertension Mother    Cancer Mother 82       breast   Diabetes Father    Hypertension Father    Heart attack Father 70   Diabetes Maternal Aunt    Hypertension Other    Cancer Other     Current Outpatient Medications:    hydrochlorothiazide (HYDRODIURIL) 25 MG tablet, Take 1 tablet (25 mg total) by mouth daily. For blood pressure, Disp: 90 tablet, Rfl: 3  No Known Allergies   ROS: Review of Systems A comprehensive review of systems was negative except for: Integument/breast: positive for spots on hands. No itching  Physical exam BP 121/76   Pulse 79   Ht 6\' 2"  (1.88 m)   Wt 255 lb (115.7 kg)   SpO2 96%   BMI 32.74 kg/m  General appearance: alert, cooperative, appears stated age, no distress, and moderately obese Head: Normocephalic, without obvious abnormality, atraumatic Eyes: negative findings: lids and lashes normal, conjunctivae and sclerae normal, corneas clear, and pupils equal, round, reactive to light and accomodation Ears: normal TM's and external ear canals both ears Nose: Nares normal. Septum midline. Mucosa normal. No drainage or sinus tenderness. Throat: lips, mucosa, and tongue normal; teeth  and gums normal Neck: no adenopathy, no carotid bruit, supple, symmetrical, trachea midline, and thyroid not enlarged, symmetric, no tenderness/mass/nodules Back: symmetric, no curvature. ROM normal. No CVA tenderness. Lungs: clear to auscultation bilaterally Chest wall: no tenderness Heart: regular rate and rhythm, S1, S2 normal, no murmur, click, rub or gallop Abdomen: soft, non-tender; bowel sounds normal; no masses,  no organomegaly Extremities: extremities normal, atraumatic, no cyanosis or edema Pulses: 2+ and symmetric Skin:  Has few tiny vesicles noted on the dorsal aspects of the DIP bilaterally. Lymph nodes: Cervical, supraclavicular, and axillary nodes normal. Neurologic: Grossly normal      07/21/2023    2:20 PM 01/18/2023    2:29 PM 12/07/2022    3:21 PM  Depression screen PHQ 2/9  Decreased Interest 0 1 1  Down, Depressed, Hopeless 0 1 1  PHQ - 2 Score 0 2 2  Altered sleeping 0 0 0  Tired, decreased energy 0 0 0  Change in appetite 0  0  Feeling bad or failure about yourself  0 0 0  Trouble concentrating 0 1 1  Moving slowly or fidgety/restless 0 0 0  Suicidal thoughts 0 0 0  PHQ-9 Score 0 3 3  Difficult doing work/chores Not difficult at all Somewhat difficult Somewhat difficult      07/21/2023    2:20 PM 01/18/2023    2:29 PM 12/07/2022    3:21 PM  GAD 7 : Generalized Anxiety Score  Nervous, Anxious, on Edge 0 0 0  Control/stop worrying 0 0 0  Worry too much - different things 0 0 0  Trouble relaxing 0 0 0  Restless 0 0 0  Easily annoyed or irritable 1 0 2  Afraid - awful might happen 0 0 0  Total GAD 7 Score 1 0 2  Anxiety Difficulty Not difficult at all Not difficult at all Not difficult at all     Assessment/ Plan: [name] here for annual physical exam.   Annual physical exam  Essential hypertension - Plan: CMP14+EGFR, hydrochlorothiazide (HYDRODIURIL) 25 MG tablet  Obesity (BMI 30.0-34.9) - Plan: CMP14+EGFR, LDL Cholesterol, Direct, Bayer  DCA Hb A1c Waived  Screening, anemia, deficiency, iron - Plan: CBC  Dyshidrotic eczema  Blood pressure under excellent control.  Continue hydrochlorothiazide.  Check renal function, electrolytes.  Actively working on weight loss with success of at least 11 pounds since our last visit.  Continue intermittent fasting, high-protein and high-fiber.  Check A1c given obesity  Check CBC for baseline  Lesions on dorsum of hands appear to be consistent with dyshidrotic eczema.  As they are not bothering him currently I do not think any interventions are needed but we discussed use of corticosteroid topical if needed going forward  Counseled on healthy lifestyle choices, including diet (rich in fruits, vegetables and lean meats and low in salt and simple carbohydrates) and exercise (at least 30 minutes of moderate physical  activity daily).  Patient to follow up 6 to 12 months, sooner if concerns arise  Robinn Overholt M. Nadine Counts, DO

## 2023-07-24 ENCOUNTER — Other Ambulatory Visit: Payer: Self-pay | Admitting: Family Medicine

## 2023-07-24 DIAGNOSIS — R748 Abnormal levels of other serum enzymes: Secondary | ICD-10-CM

## 2023-07-24 DIAGNOSIS — E7800 Pure hypercholesterolemia, unspecified: Secondary | ICD-10-CM

## 2023-07-31 ENCOUNTER — Other Ambulatory Visit: Payer: 59

## 2023-07-31 DIAGNOSIS — R748 Abnormal levels of other serum enzymes: Secondary | ICD-10-CM

## 2023-07-31 DIAGNOSIS — E7800 Pure hypercholesterolemia, unspecified: Secondary | ICD-10-CM

## 2024-02-07 ENCOUNTER — Telehealth (INDEPENDENT_AMBULATORY_CARE_PROVIDER_SITE_OTHER): Payer: 59 | Admitting: Family Medicine

## 2024-02-07 ENCOUNTER — Encounter: Payer: Self-pay | Admitting: Family Medicine

## 2024-02-07 DIAGNOSIS — J111 Influenza due to unidentified influenza virus with other respiratory manifestations: Secondary | ICD-10-CM | POA: Diagnosis not present

## 2024-02-07 MED ORDER — ONDANSETRON 8 MG PO TBDP: 8.0000 mg | ORAL_TABLET | Freq: Four times a day (QID) | ORAL | 1 refills | Status: AC | PRN

## 2024-02-07 MED ORDER — OSELTAMIVIR PHOSPHATE 75 MG PO CAPS: 75.0000 mg | ORAL_CAPSULE | Freq: Two times a day (BID) | ORAL | 0 refills | Status: AC

## 2024-02-07 NOTE — Progress Notes (Signed)
Subjective:    Patient ID: Kent Roman, male    DOB: [birth year], 28 y.o.   MRN: [id number]   HPI: Kent Roman is a 28 y.o. male presenting for aching all over. Had to leave work last night. Temp to 99.8. Congested nose. Raspy voice. Not coughing. Multiple episodes of vomiting onset yesterday. Again this AM. Chills & sweats.       07/21/2023    2:20 PM 01/18/2023    2:29 PM 12/07/2022    3:21 PM 12/07/2022    3:20 PM  Depression screen PHQ 2/9  Decreased Interest 0 1 1 0  Down, Depressed, Hopeless 0 1 1 0  PHQ - 2 Score 0 2 2 0  Altered sleeping 0 0 0   Tired, decreased energy 0 0 0   Change in appetite 0  0   Feeling bad or failure about yourself  0 0 0   Trouble concentrating 0 1 1   Moving slowly or fidgety/restless 0 0 0   Suicidal thoughts 0 0 0   PHQ-9 Score 0 3 3   Difficult doing work/chores Not difficult at all Somewhat difficult Somewhat difficult      Relevant past medical, surgical, family and social history reviewed and updated as indicated.  Interim medical history since our last visit reviewed. Allergies and medications reviewed and updated.  ROS:  Review of Systems  Constitutional:  Positive for appetite change, chills and fever. Negative for activity change.  HENT:  Positive for congestion and sore throat (raspy). Negative for ear discharge, ear pain, hearing loss, nosebleeds, sinus pressure, sneezing and trouble swallowing.   Respiratory:  Negative for cough, chest tightness and shortness of breath.   Cardiovascular:  Negative for chest pain and palpitations.  Gastrointestinal:  Positive for nausea and vomiting.  Skin:  Negative for rash.     Social History   Tobacco Use  Smoking Status Never  Smokeless Tobacco Never       Objective:     Wt Readings from Last 3 Encounters:  07/21/23 255 lb (115.7 kg)  01/18/23 266 lb (120.7 kg)  12/07/22 271 lb 6.4 oz (123.1 kg)     Exam deferred. Video visit performed.   Assessment & Plan:    1. Influenza with respiratory manifestation     Meds ordered this encounter  Medications   oseltamivir (TAMIFLU) 75 MG capsule    Sig: Take 1 capsule (75 mg total) by mouth 2 (two) times daily.    Dispense:  10 capsule    Refill:  0   ondansetron (ZOFRAN-ODT) 8 MG disintegrating tablet    Sig: Take 1 tablet (8 mg total) by mouth every 6 (six) hours as needed for nausea or vomiting.    Dispense:  20 tablet    Refill:  1    No orders of the defined types were placed in this encounter.     Diagnoses and all orders for this visit:  Influenza with respiratory manifestation  Other orders -     oseltamivir (TAMIFLU) 75 MG capsule; Take 1 capsule (75 mg total) by mouth 2 (two) times daily. -     ondansetron (ZOFRAN-ODT) 8 MG disintegrating tablet; Take 1 tablet (8 mg total) by mouth every 6 (six) hours as needed for nausea or vomiting.    Virtual Visit  Note  I discussed the limitations, risks, security and privacy concerns of performing an evaluation and management service by video and the availability of in person appointments. The patient  was identified with two identifiers. Pt.expressed understanding and agreed to proceed. Pt. Is at home. Dr. Darlyn Read is in his office.  Follow Up Instructions:   I discussed the assessment and treatment plan with the patient. The patient was provided an opportunity to ask questions and all were answered. The patient agreed with the plan and demonstrated an understanding of the instructions.   The patient was advised to call back or seek an in-person evaluation if the symptoms worsen or if the condition fails to improve as anticipated.   Total minutes contact time: 14   Follow up plan: Return if symptoms worsen or fail to improve.  Mechele Claude, MD [place] [facility]

## 2024-03-18 ENCOUNTER — Ambulatory Visit: Admitting: Nurse Practitioner

## 2024-03-18 ENCOUNTER — Encounter: Payer: Self-pay | Admitting: Nurse Practitioner

## 2024-03-18 VITALS — BP 134/87 | HR 71 | Temp 97.9°F | Ht 74.0 in | Wt 271.6 lb

## 2024-03-18 DIAGNOSIS — K921 Melena: Secondary | ICD-10-CM | POA: Insufficient documentation

## 2024-03-18 NOTE — Progress Notes (Signed)
 Acute Office Visit  Subjective:     Patient ID: Kent Roman, male    DOB: 11-24-1996, 28 y.o.   MRN: [id number]  Chief Complaint  Patient presents with   Blood In Stools    Wednesday noticed bright red blood when wiping has happened everyday since then      HPI Kent Roman is a 28 year old male presents for an acute visit on March 18, 2024, with complaints of hematochezia. He first noticed blood on the tissue and in the toilet on Wednesday, describing it as a "medium amount." The bleeding resolved but recurred on Sunday, March 17, 2024, when he noticed blood in the toilet but not on the toilet paper. He denies straining during bowel movements and reports regular bowel habits. He has no history of hemorrhoids. He denies a family history of colon cancer but notes that his mother passed away from breast cancer. He denies abdominal pain, changes in stool consistency, weight loss, or other gastrointestinal symptoms. Active Ambulatory Problems    Diagnosis Date Noted   Sebaceous cyst    Closed dorsal perilunate fracture dislocation of left wrist 02/12/2020   Family history of breast cancer in first degree relative 12/09/2022   Family history of cerebrovascular accident (CVA) in mother 12/09/2022   Family history of early CAD 12/09/2022   Bilateral leg edema 12/09/2022   Essential hypertension 12/09/2022   Obesity (BMI 30.0-34.9) 12/09/2022   Asymptomatic varicose veins of right lower extremity 01/18/2023   Hematochezia 03/18/2024   Resolved Ambulatory Problems    Diagnosis Date Noted   No Resolved Ambulatory Problems   Past Medical History:  Diagnosis Date   Asthma     Review of Systems  Constitutional:  Negative for chills and fever.  Respiratory:  Negative for cough and shortness of breath.   Cardiovascular:  Negative for chest pain and leg swelling.  Gastrointestinal:  Positive for blood in stool. Negative for abdominal pain, constipation, diarrhea, nausea and vomiting.   Skin:  Negative for itching and rash.  Neurological:  Negative for dizziness and headaches.   Negative unless indicated in HPI    Objective:    BP 134/87   Pulse 71   Temp 97.9 F (36.6 C) (Temporal)   Ht 6\' 2"  (1.88 m)   Wt 271 lb 9.6 oz (123.2 kg)   SpO2 97%   BMI 34.87 kg/m  BP Readings from Last 3 Encounters:  03/18/24 134/87  07/21/23 121/76  01/18/23 130/88   Wt Readings from Last 3 Encounters:  03/18/24 271 lb 9.6 oz (123.2 kg)  07/21/23 255 lb (115.7 kg)  01/18/23 266 lb (120.7 kg)      Physical Exam Vitals and nursing note reviewed. Exam conducted with a chaperone present Lorelle [employer]).  Constitutional:      General: He is not in acute distress.    Appearance: He is obese.  HENT:     Head: Normocephalic and atraumatic.     Nose: Nose normal.     Mouth/Throat:     Mouth: Mucous membranes are moist.  Eyes:     Extraocular Movements: Extraocular movements intact.     Conjunctiva/sclera: Conjunctivae normal.     Pupils: Pupils are equal, round, and reactive to light.  Cardiovascular:     Heart sounds: Normal heart sounds.  Pulmonary:     Effort: Pulmonary effort is normal.  Abdominal:     General: Bowel sounds are normal.     Palpations: Abdomen is soft.     Tenderness:  There is no abdominal tenderness. There is no right CVA tenderness, guarding or rebound.     Hernia: No hernia is present.  Genitourinary:    Rectum: Normal.     Comments: no hemorrhoids appreciated Musculoskeletal:        General: Normal range of motion.     Right lower leg: No edema.     Left lower leg: No edema.  Skin:    General: Skin is warm and dry.  Neurological:     Mental Status: He is alert and oriented to person, place, and time.  Psychiatric:        Mood and Affect: Mood normal.        Behavior: Behavior normal.        Thought Content: Thought content normal.        Judgment: Judgment normal.     No results found for any visits on 03/18/24.      Assessment &  Plan:  Hematochezia -     CBC with Differential/Platelet -     Fecal occult blood, imunochemical  Jiles 28 year old African-American male seen today for hematochezia, no acute distress Labs: CBC ordered Fecal occult ordered called to collect sample at home and to bring it back to the lab to be processed Increase fiber and hydration Future: Possible referral to GI for colonoscopy Encourage healthy lifestyle choices, including diet (rich in fruits, vegetables, and lean proteins, and low in salt and simple carbohydrates) and exercise (at least 30 minutes of moderate physical activity daily).     The above assessment and management plan was discussed with the patient. The patient verbalized understanding of and has agreed to the management plan. Patient is aware to call the clinic if they develop any new symptoms or if symptoms persist or worsen. Patient is aware when to return to the clinic for a follow-up visit. Patient educated on when it is appropriate to go to the emergency department.  Return if symptoms worsen or fail to improve.  Arrie Aran Santa Lighter, [facility] [address] [place], [place] [address] [phone]  Note: This document was prepared by Reubin Milan voice dictation technology and any errors that results from this process are unintentional.

## 2024-03-19 LAB — FECAL OCCULT BLOOD, IMMUNOCHEMICAL: Fecal Occult Bld: POSITIVE — AB

## 2024-03-20 ENCOUNTER — Other Ambulatory Visit: Payer: Self-pay | Admitting: *Deleted

## 2024-03-20 ENCOUNTER — Other Ambulatory Visit: Payer: Self-pay | Admitting: Nurse Practitioner

## 2024-03-20 DIAGNOSIS — R195 Other fecal abnormalities: Secondary | ICD-10-CM

## 2024-03-20 DIAGNOSIS — K921 Melena: Secondary | ICD-10-CM

## 2024-03-21 ENCOUNTER — Encounter: Payer: Self-pay | Admitting: *Deleted

## 2024-04-01 NOTE — Progress Notes (Unsigned)
 GI Office Note    Referring Provider: Evern Bio, Michelle Piper* Primary Care Physician:  Raliegh Ip, DO  Primary Gastroenterologist: Hennie Duos. Marletta Lor, DO  Chief Complaint   No chief complaint on file.   History of Present Illness   Kent Roman is a 28 y.o. male presenting today at the request of [address] [place] for hematochezia.   Visit with primary care on 03/08/2024 with reports of hematochezia.  Had an occurrence a few days prior to his office visit where he had toilet tissue medic easy as well as a medium amount within the commode and then the day prior to office visit he had blood in the toilet but not on the toilet paper.  He denied straining with bowel movements and reported regular bowel habits and denied any history of hemorrhoids.  Also denied family history of colon cancer but with family history of breast cancer in his mother.  No other GI symptoms reported.  CBC and fecal occult test ordered.  He was advised to increase fiber and hydration.  Advised possible referral to GI.  Fecal occult test positive.  CBC not obtained.  Today:    Wt Readings from Last 3 Encounters:  03/18/24 271 lb 9.6 oz (123.2 kg)  07/21/23 255 lb (115.7 kg)  01/18/23 266 lb (120.7 kg)    Current Outpatient Medications  Medication Sig Dispense Refill   hydrochlorothiazide (HYDRODIURIL) 25 MG tablet Take 1 tablet (25 mg total) by mouth daily. For blood pressure 90 tablet 3   ondansetron (ZOFRAN-ODT) 8 MG disintegrating tablet Take 1 tablet (8 mg total) by mouth every 6 (six) hours as needed for nausea or vomiting. 20 tablet 1   oseltamivir (TAMIFLU) 75 MG capsule Take 1 capsule (75 mg total) by mouth 2 (two) times daily. (Patient not taking: Reported on 03/18/2024) 10 capsule 0   No current facility-administered medications for this visit.    Past Medical History:  Diagnosis Date   Asthma     Past Surgical History:  Procedure Laterality Date   HARDWARE REMOVAL Left  04/22/2020   Procedure: HARDWARE REMOVAL deep implant removal;  Surgeon: Bradly Bienenstock, MD;  Location: [facility];  Service: Orthopedics;  Laterality: Left;   LIGAMENT REPAIR  02/12/2020   Procedure: LIGAMENT REPAIR;  Surgeon: Bradly Bienenstock, MD;  Location: [facility];  Service: Orthopedics;;   MASS EXCISION N/A 04/02/2018   Procedure: EXCISION 1.5 CYST SCALP;  Surgeon: Franky Macho, MD;  Location: [facility];  Service: General;  Laterality: N/A;   ORIF WRIST FRACTURE Left 02/08/2020   Procedure: CLOSED REDUCTION WRIST FRACTURE;  Surgeon: Bradly Bienenstock, MD;  Location: [facility];  Service: Orthopedics;  Laterality: Left;   ORIF WRIST FRACTURE Left 02/12/2020   Procedure: WRIST LIGAMENTOUS RECONSTRUCTION AND TENDON/LIGAMENT GRAFTING RECONSTRUCTION AS INDICATED;  Surgeon: Bradly Bienenstock, MD;  Location: [facility];  Service: Orthopedics;  Laterality: Left;  WITH REGIONAL BLOCK NEEDS 2 HOURS   WISDOM TOOTH EXTRACTION      Family History  Problem Relation Age of Onset   Hypertension Mother    Cancer Mother 44       breast   Diabetes Father    Hypertension Father    Heart attack Father 39   Diabetes Maternal Aunt    Hypertension Other    Cancer Other     Allergies as of 04/02/2024   (No Known Allergies)    Social History   Socioeconomic History   Marital status:  Single    Spouse name: Not on file   Number of children: 0   Years of education: Not on file   Highest education level: Associate degree: academic program  Occupational History   Not on file  Tobacco Use   Smoking status: Never   Smokeless tobacco: Never  Vaping Use   Vaping status: Never Used  Substance and Sexual Activity   Alcohol use: No   Drug use: Yes    Types: Marijuana    Comment: 2-3 times daily use   Sexual activity: Yes    Birth control/protection: Condom  Other Topics Concern   Not on file  Social History Narrative   Currently pursuing college degree in early childhood  development. 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 Not in a relationship currently. No children.   Social Drivers of Corporate investment banker Strain: Low Risk  (02/07/2024)   Overall Financial Resource Strain (CARDIA)    Difficulty of Paying Living Expenses: Not very hard  Food Insecurity: No Food Insecurity (02/07/2024)   Hunger Vital Sign    Worried About Running Out of Food in the Last Year: Never true    Ran Out of Food in the Last Year: Never true  Transportation Needs: No Transportation Needs (02/07/2024)   PRAPARE - Administrator, Civil Service (Medical): No    Lack of Transportation (Non-Medical): No  Physical Activity: Sufficiently Active (02/07/2024)   Exercise Vital Sign    Days of Exercise per Week: 4 days    Minutes of Exercise per Session: 60 min  Stress: No Stress Concern Present (02/07/2024)   [employer] of Occupational Health - Occupational Stress Questionnaire    Feeling of Stress : Not at all  Social Connections: Unknown (02/07/2024)   Social Connection and Isolation Panel [NHANES]    Frequency of Communication with Friends and Family: Twice a week    Frequency of Social Gatherings with Friends and Family: Twice a week    Attends Religious Services: 1 to 4 times per year    Active Member of [employer] or Organizations: No    Attends Engineer, structural: Not on file    Marital Status: Patient declined  Intimate Partner Violence: Not on file     Review of Systems   Gen: Denies any fever, chills, fatigue, weight loss, lack of appetite.  CV: Denies chest pain, heart palpitations, peripheral edema, syncope.  Resp: Denies shortness of breath at rest or with exertion. Denies wheezing or cough.  GI: see HPI GU : Denies urinary burning, urinary frequency, urinary hesitancy MS: Denies joint pain, muscle weakness, cramps, or limitation of movement.  Derm: Denies rash, itching, dry skin Psych:  Denies depression, anxiety, memory loss, and confusion Heme: Denies bruising, bleeding, and enlarged lymph nodes.  Physical Exam   There were no vitals taken for this visit.  General:   Alert and oriented. Pleasant and cooperative. Well-nourished and well-developed.  Head:  Normocephalic and atraumatic. Eyes:  Without icterus, sclera clear and conjunctiva pink.  Ears:  Normal auditory acuity. Mouth:  No deformity or lesions, oral mucosa pink.  Lungs:  Clear to auscultation bilaterally. No wheezes, rales, or rhonchi. No distress.  Heart:  S1, S2 present without murmurs appreciated.  Abdomen:  +BS, soft, non-tender and non-distended. No HSM noted. No guarding or rebound. No masses appreciated.  Rectal: *** Msk:  Symmetrical without gross deformities. Normal posture. Extremities:  Without edema. Neurologic:  Alert and  oriented x4;  grossly normal neurologically. Skin:  Intact without significant lesions or rashes. Psych:  Alert and cooperative. Normal mood and affect.  Assessment   Kent Roman is a 28 y.o. male with a history of *** presenting today with   Hematochezia:  PLAN   ***    Julian Obey, MSN, FNP-BC, AGACNP-BC [facility]

## 2024-04-02 ENCOUNTER — Ambulatory Visit (INDEPENDENT_AMBULATORY_CARE_PROVIDER_SITE_OTHER): Payer: Self-pay | Admitting: Gastroenterology

## 2024-04-02 ENCOUNTER — Encounter: Payer: Self-pay | Admitting: Gastroenterology

## 2024-04-02 VITALS — BP 145/98 | HR 65 | Temp 97.6°F | Ht 74.0 in | Wt 269.8 lb

## 2024-04-02 DIAGNOSIS — K219 Gastro-esophageal reflux disease without esophagitis: Secondary | ICD-10-CM | POA: Diagnosis not present

## 2024-04-02 DIAGNOSIS — I10 Essential (primary) hypertension: Secondary | ICD-10-CM

## 2024-04-02 DIAGNOSIS — K921 Melena: Secondary | ICD-10-CM | POA: Diagnosis not present

## 2024-04-02 NOTE — Patient Instructions (Addendum)
 Follow a GERD diet:  Limit or Avoid fried, fatty, greasy, spicy, citrus foods. Limit or Avoid caffeine and carbonated beverages. Avoid chocolate. Try eating 4-6 small meals a day rather than 3 large meals. Do not eat within 3 hours of laying down. Prop head of bed up on wood or bricks to create a 6 inch incline.  You may use famotidine 10-20 mg 1-2 times a day as needed for acid reflux symptoms.  If your symptoms are only occurring with certain trigger foods, try to avoid these or if you are craving it then I would take famotidine at least 30 minutes prior to that meal.  If symptoms become severe or are associated with nausea you may take Tums.  I suspect that your bleeding is secondary to hemorrhoids given you have not had any recurrence.  I recommend that you limit your time on the commode to less than 5 minutes.  If you have recurrence of rectal bleeding that is similar to the prior episodes then I recommend Preparation H apply twice a day internally and use this for 2-3 days, up to 5 days if needed.  If bleeding worsens, becomes more frequent, or is associated with rectal pain please call the office or send a MyChart message.  If blood pressure continues to be elevated, I recommend you follow back up with your primary care to discuss possible treatment.  It was a pleasure to see you today. I want to create trusting relationships with patients. If you receive a survey regarding your visit,  I greatly appreciate you taking time to fill this out on paper or through your MyChart. I value your feedback.  Julian Obey, MSN, FNP-BC, AGACNP-BC [facility]

## 2024-06-13 ENCOUNTER — Encounter: Payer: Self-pay | Admitting: Gastroenterology

## 2024-07-22 ENCOUNTER — Encounter: Payer: 59 | Admitting: Family Medicine

## 2024-07-22 DIAGNOSIS — E7800 Pure hypercholesterolemia, unspecified: Secondary | ICD-10-CM | POA: Insufficient documentation

## 2024-07-22 NOTE — Progress Notes (Deleted)
 Kent Roman is a 28 y.o. male presents to office today for annual physical exam examination.    Concerns today include: 1. ***  Occupation: ***, Marital status: ***, Substance use: *** Health Maintenance Due  Topic Date Due   HIV Screening  Never done   Hepatitis B Vaccines (1 of 3 - 19+ 3-dose series) Never done   HPV VACCINES (1 - 3-dose SCDM series) Never done   COVID-19 Vaccine (1 - 2024-25 season) Never done   INFLUENZA VACCINE  07/19/2024   Refills needed today: ***  Immunization History  Administered Date(s) Administered   Td 07/09/2015   Past Medical History:  Diagnosis Date   Asthma    Social History   Socioeconomic History   Marital status: Single    Spouse name: Not on file   Number of children: 0   Years of education: Not on file   Highest education level: Associate degree: academic program  Occupational History   Not on file  Tobacco Use   Smoking status: Never   Smokeless tobacco: Never  Vaping Use   Vaping status: Some Days  Substance and Sexual Activity   Alcohol use: No   Drug use: Yes    Types: Marijuana    Comment: 2-3 times daily use   Sexual activity: Yes    Birth control/protection: Condom  Other Topics Concern   Not on file  Social History Narrative   Currently pursuing college degree in early childhood development. 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 Not in a relationship currently. No children.   Social Drivers of Corporate investment banker Strain: Low Risk  (02/07/2024)   Overall Financial Resource Strain (CARDIA)    Difficulty of Paying Living Expenses: Not very hard  Food Insecurity: No Food Insecurity (02/07/2024)   Hunger Vital Sign    Worried About Running Out of Food in the Last Year: Never true    Ran Out of Food in the Last Year: Never true  Transportation Needs: No Transportation Needs (02/07/2024)   PRAPARE - Administrator, Civil Service (Medical):  No    Lack of Transportation (Non-Medical): No  Physical Activity: Sufficiently Active (02/07/2024)   Exercise Vital Sign    Days of Exercise per Week: 4 days    Minutes of Exercise per Session: 60 min  Stress: No Stress Concern Present (02/07/2024)   [employer] of Occupational Health - Occupational Stress Questionnaire    Feeling of Stress : Not at all  Social Connections: Unknown (02/07/2024)   Social Connection and Isolation Panel    Frequency of Communication with Friends and Family: Twice a week    Frequency of Social Gatherings with Friends and Family: Twice a week    Attends Religious Services: 1 to 4 times per year    Active Member of [employer] or Organizations: No    Attends Banker Meetings: Not on file    Marital Status: Patient declined  Intimate Partner Violence: Not on file   Past Surgical History:  Procedure Laterality Date   HARDWARE REMOVAL Left 04/22/2020   Procedure: HARDWARE REMOVAL deep implant removal;  Surgeon: Shari Easter, MD;  Location: [facility];  Service: Orthopedics;  Laterality: Left;   LIGAMENT REPAIR  02/12/2020   Procedure: LIGAMENT REPAIR;  Surgeon: Shari Easter, MD;  Location: [facility];  Service: Orthopedics;;   MASS EXCISION N/A 04/02/2018   Procedure:  EXCISION 1.5 CYST SCALP;  Surgeon: Mavis Anes, MD;  Location: [facility];  Service: General;  Laterality: N/A;   ORIF WRIST FRACTURE Left 02/08/2020   Procedure: CLOSED REDUCTION WRIST FRACTURE;  Surgeon: Shari Easter, MD;  Location: [facility];  Service: Orthopedics;  Laterality: Left;   ORIF WRIST FRACTURE Left 02/12/2020   Procedure: WRIST LIGAMENTOUS RECONSTRUCTION AND TENDON/LIGAMENT GRAFTING RECONSTRUCTION AS INDICATED;  Surgeon: Shari Easter, MD;  Location: [facility];  Service: Orthopedics;  Laterality: Left;  WITH REGIONAL BLOCK NEEDS 2 HOURS   WISDOM TOOTH EXTRACTION     Family History  Problem Relation Age of Onset   Hypertension  Mother    Cancer Mother 37       breast   Diabetes Father    Hypertension Father    Heart attack Father 58   Diabetes Maternal Aunt    Hypertension Other    Cancer Other     Current Outpatient Medications:    hydrochlorothiazide  (HYDRODIURIL ) 25 MG tablet, Take 1 tablet (25 mg total) by mouth daily. For blood pressure, Disp: 90 tablet, Rfl: 3   ondansetron  (ZOFRAN -ODT) 8 MG disintegrating tablet, Take 1 tablet (8 mg total) by mouth every 6 (six) hours as needed for nausea or vomiting. (Patient not taking: Reported on 04/02/2024), Disp: 20 tablet, Rfl: 1   oseltamivir  (TAMIFLU ) 75 MG capsule, Take 1 capsule (75 mg total) by mouth 2 (two) times daily. (Patient not taking: Reported on 04/02/2024), Disp: 10 capsule, Rfl: 0  Allergies  Allergen Reactions   Bee Pollen Hives     ROS: Review of Systems {ros; complete:30496}    Physical exam {Exam, Complete:[phone]}      07/21/2023    2:20 PM 01/18/2023    2:29 PM 12/07/2022    3:21 PM  Depression screen PHQ 2/9  Decreased Interest 0 1 1  Down, Depressed, Hopeless 0 1 1  PHQ - 2 Score 0 2 2  Altered sleeping 0 0 0  Tired, decreased energy 0 0 0  Change in appetite 0  0  Feeling bad or failure about yourself  0 0 0  Trouble concentrating 0 1 1  Moving slowly or fidgety/restless 0 0 0  Suicidal thoughts 0 0 0  PHQ-9 Score 0 3 3  Difficult doing work/chores Not difficult at all Somewhat difficult Somewhat difficult      07/21/2023    2:20 PM 01/18/2023    2:29 PM 12/07/2022    3:21 PM  GAD 7 : Generalized Anxiety Score  Nervous, Anxious, on Edge 0 0 0  Control/stop worrying 0 0 0  Worry too much - different things 0 0 0  Trouble relaxing 0 0 0  Restless 0 0 0  Easily annoyed or irritable 1 0 2  Afraid - awful might happen 0 0 0  Total GAD 7 Score 1 0 2  Anxiety Difficulty Not difficult at all Not difficult at all Not difficult at all     Assessment/ Plan: Kent Roman here for annual physical exam.   Annual  physical exam  Essential hypertension  Pure hypercholesterolemia  Obesity (BMI 30.0-34.9)  Hematochezia  ***  Counseled on healthy lifestyle choices, including diet (rich in fruits, vegetables and lean meats and low in salt and simple carbohydrates) and exercise (at least 30 minutes of moderate physical activity daily).  Patient to follow up ***  Vernadette Stutsman M. Jolinda, DO

## 2025-03-19 ENCOUNTER — Encounter: Payer: Self-pay | Admitting: Family Medicine
